# Patient Record
Sex: Female | Born: 1965 | Race: White | Hispanic: No | Marital: Married | Smoking: Never smoker
Health system: Southern US, Community
[De-identification: ages and names within clinical notes are randomized; demographics above are authoritative.]

## PROBLEM LIST (undated history)

## (undated) DIAGNOSIS — T7840XA Allergy, unspecified, initial encounter: Secondary | ICD-10-CM

## (undated) DIAGNOSIS — E559 Vitamin D deficiency, unspecified: Secondary | ICD-10-CM

## (undated) DIAGNOSIS — E079 Disorder of thyroid, unspecified: Secondary | ICD-10-CM

## (undated) DIAGNOSIS — E539 Vitamin B deficiency, unspecified: Secondary | ICD-10-CM

## (undated) DIAGNOSIS — E785 Hyperlipidemia, unspecified: Secondary | ICD-10-CM

## (undated) DIAGNOSIS — M1990 Unspecified osteoarthritis, unspecified site: Secondary | ICD-10-CM

## (undated) HISTORY — DX: Allergy, unspecified, initial encounter: T78.40XA

## (undated) HISTORY — DX: Vitamin D deficiency, unspecified: E55.9

## (undated) HISTORY — PX: COLONOSCOPY: SHX174

## (undated) HISTORY — DX: Vitamin B deficiency, unspecified: E53.9

## (undated) HISTORY — PX: POLYPECTOMY: SHX149

## (undated) HISTORY — DX: Hyperlipidemia, unspecified: E78.5

## (undated) HISTORY — DX: Disorder of thyroid, unspecified: E07.9

## (undated) HISTORY — DX: Unspecified osteoarthritis, unspecified site: M19.90

## (undated) HISTORY — PX: PARTIAL HYSTERECTOMY: SHX80

## (undated) HISTORY — PX: ABDOMINAL HYSTERECTOMY: SHX81

## (undated) HISTORY — PX: TOTAL ABDOMINAL HYSTERECTOMY: SHX209

## (undated) HISTORY — PX: NECK SURGERY: SHX720

---

## 2009-09-20 ENCOUNTER — Encounter: Admission: RE | Admit: 2009-09-20 | Discharge: 2009-09-20 | Payer: Self-pay | Admitting: Allergy and Immunology

## 2010-06-28 ENCOUNTER — Emergency Department (HOSPITAL_COMMUNITY)
Admission: EM | Admit: 2010-06-28 | Discharge: 2010-06-29 | Disposition: A | Discharge status: Home / Self Care | Payer: BC Managed Care – PPO | Attending: Emergency Medicine | Admitting: Emergency Medicine

## 2010-06-28 DIAGNOSIS — G43909 Migraine, unspecified, not intractable, without status migrainosus: Secondary | ICD-10-CM | POA: Insufficient documentation

## 2010-06-29 LAB — POCT I-STAT, CHEM 8
BUN: 11 mg/dL (ref 6–23)
Creatinine, Ser: 0.7 mg/dL (ref 0.4–1.2)
TCO2: 30 mmol/L (ref 0–100)

## 2011-01-14 ENCOUNTER — Encounter: Payer: Self-pay | Admitting: *Deleted

## 2011-01-14 ENCOUNTER — Emergency Department (HOSPITAL_COMMUNITY)
Admission: EM | Admit: 2011-01-14 | Discharge: 2011-01-14 | Disposition: A | Discharge status: Home / Self Care | Payer: Managed Care, Other (non HMO) | Attending: Emergency Medicine | Admitting: Emergency Medicine

## 2011-01-14 DIAGNOSIS — G43909 Migraine, unspecified, not intractable, without status migrainosus: Secondary | ICD-10-CM | POA: Insufficient documentation

## 2011-01-14 MED ORDER — SUMATRIPTAN SUCCINATE 6 MG/0.5ML ~~LOC~~ SOLN
6.0000 mg | Freq: Once | SUBCUTANEOUS | Status: AC
  Administered 2011-01-14: 6 mg via SUBCUTANEOUS
  Filled 2011-01-14: qty 0.5

## 2011-01-14 NOTE — ED Notes (Signed)
Pt c/o migraine starting yesterday at noon. Pt c/o nausea starting at 0200, took zofran odt w/ relief from nausea. Denies vomiting.

## 2011-01-14 NOTE — ED Provider Notes (Signed)
History     CSN: [id number] Arrival date & time: 01/14/2011  4:32 AM   First MD Initiated Contact with Patient 01/14/11 0539      Chief Complaint  Patient presents with  . Migraine  . Nausea    (Consider location/radiation/quality/duration/timing/severity/associated sxs/prior treatment) HPI Comments: Patient has a history of migraine headaches and she was 45 years old. Patient developed a right-sided headache similar to her migraine pain at 12:30 PM yesterday. It gradually has gotten worse by 11 PM. She is out of her usual Imitrex subcutaneous that she normally takes. She cannot get it refilled until today. She has some mild nausea and endorses mild photophobia. She denies any recent head injury, fevers, neck stiffness. Again she reports that this headache is exactly like her prior migraines.  Patient is a 45 y.o. female presenting with migraine. The history is provided by the patient.  Migraine Associated symptoms include headaches. Pertinent negatives include no abdominal pain.    Past Medical History  Diagnosis Date  . Migraine     Past Surgical History  Procedure Date  . Abdominal hysterectomy     History reviewed. No pertinent family history.  History  Substance Use Topics  . Smoking status: Never Smoker   . Smokeless tobacco: Not on file  . Alcohol Use: No    OB History    Grav Para Term Preterm Abortions TAB SAB Ect Mult Living                  Review of Systems  Constitutional: Negative.   HENT: Negative for congestion, rhinorrhea and sinus pressure.   Eyes: Positive for photophobia.  Gastrointestinal: Positive for nausea. Negative for vomiting and abdominal pain.  Neurological: Positive for headaches. Negative for dizziness, weakness, light-headedness and numbness.    Allergies  Review of patient's allergies indicates no known allergies.  Home Medications   Current Outpatient Rx  Name Route Sig Dispense Refill  . ONDANSETRON 8 MG PO TBDP Oral  Take 8 mg by mouth every 8 (eight) hours as needed.      . SUMATRIPTAN SUCCINATE 6 MG/0.5ML Cool SOLN Subcutaneous Inject 6 mg into the skin every 2 (two) hours as needed. F    . ZONISAMIDE 100 MG PO CAPS Oral Take 100 mg by mouth daily.       BP 133/76  Pulse 76  Temp(Src) 97.9 F (36.6 C) (Oral)  Resp 18  Ht 5\' 9"  (1.753 m)  Wt 166 lb 10.7 oz (75.6 kg)  BMI 24.61 kg/m2  SpO2 100%  Physical Exam  Constitutional: She is oriented to person, place, and time. She appears well-developed and well-nourished. No distress.  HENT:  Head: Normocephalic and atraumatic.  Neck: Normal range of motion. Neck supple.  Abdominal: Soft. There is no tenderness.  Neurological: She is alert and oriented to person, place, and time. She has normal reflexes. No cranial nerve deficit. Coordination normal.  Skin: Skin is warm and dry.  Psychiatric: She has a normal mood and affect.    ED Course  Procedures (including critical care time)  Labs Reviewed - No data to display No results found.   No diagnosis found.    MDM    Patient reports she is quite certain that if she was given Imitrex subcutaneous that she would feel better within 5-10 minutes. I suspect that this is just a typical migraine for her. Plan is to give her subcutaneous Imitrex and if she feels improved we'll discharge her home.  Gavin Pound. Oletta Lamas, MD 01/14/11 [phone]

## 2011-02-15 ENCOUNTER — Ambulatory Visit (HOSPITAL_BASED_OUTPATIENT_CLINIC_OR_DEPARTMENT_OTHER)
Admission: RE | Admit: 2011-02-15 | Discharge: 2011-02-15 | Disposition: A | Discharge status: Home / Self Care | Payer: Managed Care, Other (non HMO) | Source: Ambulatory Visit | Attending: Family Medicine | Admitting: Family Medicine

## 2011-02-15 ENCOUNTER — Other Ambulatory Visit (HOSPITAL_BASED_OUTPATIENT_CLINIC_OR_DEPARTMENT_OTHER): Payer: Self-pay | Admitting: Family Medicine

## 2011-02-15 DIAGNOSIS — G43909 Migraine, unspecified, not intractable, without status migrainosus: Secondary | ICD-10-CM | POA: Insufficient documentation

## 2012-02-06 ENCOUNTER — Ambulatory Visit (INDEPENDENT_AMBULATORY_CARE_PROVIDER_SITE_OTHER): Payer: BC Managed Care – PPO | Admitting: Family Medicine

## 2012-02-06 VITALS — BP 110/78 | HR 77 | Temp 98.1°F | Resp 16 | Ht 68.0 in | Wt 158.0 lb

## 2012-02-06 DIAGNOSIS — J069 Acute upper respiratory infection, unspecified: Secondary | ICD-10-CM

## 2012-02-06 DIAGNOSIS — J0100 Acute maxillary sinusitis, unspecified: Secondary | ICD-10-CM

## 2012-02-06 MED ORDER — FLUTICASONE PROPIONATE 50 MCG/ACT NA SUSP: 2.0000 | Freq: Every day | NASAL | Status: DC

## 2012-02-06 MED ORDER — AMOXICILLIN-POT CLAVULANATE 875-125 MG PO TABS: 1.0000 | ORAL_TABLET | Freq: Two times a day (BID) | ORAL | Status: DC

## 2012-02-06 NOTE — Patient Instructions (Addendum)
1. Sinusitis, acute maxillary  amoxicillin-clavulanate (AUGMENTIN) 875-125 MG per tablet, fluticasone (FLONASE) 50 MCG/ACT nasal spray  2. Acute upper respiratory infections of unspecified site       CONTINUE MUCINEX TWICE DAILY FOR THE NEXT WEEK.

## 2012-02-06 NOTE — Progress Notes (Signed)
[address]   [place], [place]  [address]   [id number]  Subjective:    Patient ID: Elizabeth Harris, female    DOB: 1965/03/11, 46 y.o.   MRN: [id number]  HPIThis 46 y.o. female presents for evaluation of sinus congestion.  Onset five days ago.  Treated with Mucinex.  Now has spread to chest.  No fever/chills/sweats.  +sinus headache frontal region B.  +R ear pain; mild ST due to drainage.  Mild nasal congestion; no rhinorrhea.  No cough.  No v/d.  No SOB with ambulation.  No asthma hx; no tobacco abuse.  No other medications.   PCP:  Sharyne Peach, MD  Review of Systems  Constitutional: Negative for fever, chills, diaphoresis and fatigue.  HENT: Positive for ear pain, congestion, sore throat, voice change, postnasal drip and sinus pressure. Negative for hearing loss, rhinorrhea, sneezing, mouth sores and trouble swallowing.   Respiratory: Negative for cough, shortness of breath and wheezing.   Gastrointestinal: Positive for nausea. Negative for vomiting and diarrhea.  Neurological: Positive for headaches.    Past Medical History  Diagnosis Date  . Migraine   . Allergy   . Arthritis     Past Surgical History  Procedure Date  . Abdominal hysterectomy     Prior to Admission medications   Medication Sig Start Date End Date Taking? Authorizing Provider  SUMAtriptan (IMITREX) 6 MG/0.5ML SOLN injection Inject 6 mg into the skin every 2 (two) hours as needed. F   Yes Historical Provider, MD  zonisamide (ZONEGRAN) 100 MG capsule Take 100 mg by mouth daily.    Yes Historical Provider, MD  amoxicillin-clavulanate (AUGMENTIN) 875-125 MG per tablet Take 1 tablet by mouth 2 (two) times daily. 02/06/12   Ethelda Chick, MD  fluticasone (FLONASE) 50 MCG/ACT nasal spray Place 2 sprays into the nose daily. 02/06/12   Ethelda Chick, MD  ondansetron (ZOFRAN-ODT) 8 MG disintegrating tablet Take 8 mg by mouth every 8 (eight) hours as needed.      Historical Provider, MD    No Known  Allergies  History   Social History  . Marital Status: Married    Spouse Name: N/A    Number of Children: N/A  . Years of Education: N/A   Occupational History  . Not on file.   Social History Main Topics  . Smoking status: Never Smoker   . Smokeless tobacco: Not on file  . Alcohol Use: No  . Drug Use: No  . Sexually Active:    Other Topics Concern  . 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      No family history on file.      Objective:   Physical Exam  Nursing note and vitals reviewed. Constitutional: She is oriented to person, place, and time. She appears well-developed and well-nourished. No distress.  HENT:  Head: Normocephalic and atraumatic.  Right Ear: External ear normal.  Left Ear: External ear normal.  Nose: Rhinorrhea present.  Mouth/Throat: Mucous membranes are normal. Oropharyngeal exudate present. No posterior oropharyngeal edema, posterior oropharyngeal erythema or tonsillar abscesses.  Eyes: Conjunctivae normal are normal. Pupils are equal, round, and reactive to light.  Neck: Normal range of motion. Neck supple.  Cardiovascular: Normal rate, regular rhythm, normal heart sounds and intact distal pulses.   Pulmonary/Chest: Effort normal and breath sounds normal.  Lymphadenopathy:    She has no cervical adenopathy.  Neurological: She is  alert and oriented to person, place, and time.  Skin: Skin is warm and dry. No rash noted. She is not diaphoretic.  Psychiatric: She has a normal mood and affect. Her behavior is normal. Judgment and thought content normal.       Assessment & Plan:   1. Sinusitis, acute maxillary  amoxicillin-clavulanate (AUGMENTIN) 875-125 MG per tablet, fluticasone (FLONASE) 50 MCG/ACT nasal spray  2. Acute upper respiratory infections of unspecified site        1. Acute Sinusitis Maxillary:  New.  Rx for Augmentin, Flonase.  Continue Mucinex  bid.  Supportive care with rest, fluids, Tylenol or Motrin. 2.  URI: New.  Etiology to sinusitis.     Meds ordered this encounter  Medications  . amoxicillin-clavulanate (AUGMENTIN) 875-125 MG per tablet    Sig: Take 1 tablet by mouth 2 (two) times daily.    Dispense:  20 tablet    Refill:  0  . fluticasone (FLONASE) 50 MCG/ACT nasal spray    Sig: Place 2 sprays into the nose daily.    Dispense:  16 g    Refill:  6

## 2012-10-19 ENCOUNTER — Ambulatory Visit (INDEPENDENT_AMBULATORY_CARE_PROVIDER_SITE_OTHER): Payer: BC Managed Care – HMO | Admitting: Physician Assistant

## 2012-10-19 VITALS — BP 116/80 | HR 84 | Temp 98.1°F | Resp 16 | Ht 68.25 in | Wt 166.0 lb

## 2012-10-19 DIAGNOSIS — J302 Other seasonal allergic rhinitis: Secondary | ICD-10-CM | POA: Insufficient documentation

## 2012-10-19 DIAGNOSIS — J309 Allergic rhinitis, unspecified: Secondary | ICD-10-CM

## 2012-10-19 DIAGNOSIS — J329 Chronic sinusitis, unspecified: Secondary | ICD-10-CM

## 2012-10-19 DIAGNOSIS — G43909 Migraine, unspecified, not intractable, without status migrainosus: Secondary | ICD-10-CM | POA: Insufficient documentation

## 2012-10-19 MED ORDER — IPRATROPIUM BROMIDE 0.03 % NA SOLN: 2.0000 | Freq: Two times a day (BID) | NASAL | Status: DC

## 2012-10-19 MED ORDER — AMOXICILLIN-POT CLAVULANATE 875-125 MG PO TABS: 1.0000 | ORAL_TABLET | Freq: Two times a day (BID) | ORAL | Status: DC

## 2012-10-19 NOTE — Progress Notes (Signed)
Subjective:    Patient ID: Elizabeth Harris, female    DOB: 04-Dec-1965, 47 y.o.   MRN: [id number]  HPI This 47 y.o. female presents for evaluation of "sinusitis."  Has chronic allergies, maintained on Flonase and OTC Zyrtec.  She previously received immunotherapy at [facility], but stopped due to work and school and difficulty getting there for injections. She has had increased symptoms this summer and has called to schedule follow-up to consider restarting injections.  In the meantime, her symptoms have worsened more and she believes that she has an infection.  Last sinusitis was 01/2012.  She reports congestion, drainage, pressure in her face.  Since last night she has also had pain in her teeth and ears.  No fever, chills, N/V, diarrhea, myalgias/arthralgias or rash.   Past Medical History  Diagnosis Date  . Migraine   . Allergy   . Arthritis     Past Surgical History  Procedure Laterality Date  . Abdominal hysterectomy      Prior to Admission medications   Medication Sig Start Date End Date Taking? Authorizing Provider  cetirizine (ZYRTEC) 10 MG tablet Take 10 mg by mouth daily.   Yes Historical Provider, MD  fluticasone (FLONASE) 50 MCG/ACT nasal spray Place 2 sprays into the nose daily. 02/06/12  Yes Ethelda Chick, MD  SUMAtriptan (IMITREX) 6 MG/0.5ML SOLN injection Inject 6 mg into the skin every 2 (two) hours as needed. F   Yes Historical Provider, MD  zonisamide (ZONEGRAN) 100 MG capsule Take 100 mg by mouth daily.    Yes Historical Provider, MD    No Known Allergies  History   Social History  . Marital Status: Married    Spouse Name: Shenicka Sunderlin    Number of Children: 1  . Years of Education: N/A   Occupational History  . Project Coordinator    Social History Main Topics  . Smoking status: Never Smoker   . Smokeless tobacco: Never Used  . Alcohol Use: No  . Drug Use: No  . Sexual Activity: Not on file   Other Topics Concern  . 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     Lives with her husband.   Her son Marsa Aris lives with them. His children, Thayer Ohm and Vernona Rieger, also live with them.    Family History  Problem Relation Age of Onset  . Hypertension Mother   . Diabetes Father   . Heart disease Father       Review of Systems As above.    Objective:   Physical Exam  Vitals reviewed. Constitutional: She is oriented to person, place, and time. Vital signs are normal. She appears well-developed and well-nourished. No distress.  HENT:  Head: Normocephalic and atraumatic.  Right Ear: Hearing, tympanic membrane, external ear and ear canal normal.  Left Ear: Hearing, tympanic membrane, external ear and ear canal normal.  Nose: Mucosal edema and rhinorrhea present.  No foreign bodies. Right sinus exhibits no maxillary sinus tenderness and no frontal sinus tenderness. Left sinus exhibits no maxillary sinus tenderness and no frontal sinus tenderness.  Mouth/Throat: Uvula is midline, oropharynx is clear and moist and mucous membranes are normal. No edematous. No oropharyngeal exudate.  Eyes: Conjunctivae and EOM are normal. Pupils are equal, round, and reactive to light. Right eye exhibits no discharge. Left eye exhibits no discharge. No scleral icterus.  Neck: Trachea normal, normal range of motion and full passive  range of motion without pain. Neck supple. No mass and no thyromegaly present.  Cardiovascular: Normal rate, regular rhythm and normal heart sounds.   Pulmonary/Chest: Effort normal and breath sounds normal.  Lymphadenopathy:       Head (right side): No submandibular, no tonsillar, no preauricular, no posterior auricular and no occipital adenopathy present.       Head (left side): No submandibular, no tonsillar, no preauricular and no occipital adenopathy present.    She has no cervical adenopathy.       Right: No supraclavicular adenopathy present.        Left: No supraclavicular adenopathy present.  Neurological: She is alert and oriented to person, place, and time. She has normal strength. No cranial nerve deficit or sensory deficit.  Skin: Skin is warm, dry and intact. No rash noted.  Psychiatric: She has a normal mood and affect. Her speech is normal and behavior is normal.          Assessment & Plan:  Allergic rhinitis -Plan: proceed with follow-up with allergist.  She'll ask about Singulair as a possible addition to her regimen.  Sinusitis - Plan: amoxicillin-clavulanate (AUGMENTIN) 875-125 MG per tablet, ipratropium (ATROVENT) 0.03 % nasal spray  Fernande Bras, PA-C Physician Assistant-Certified [facility]

## 2012-10-19 NOTE — Patient Instructions (Addendum)
Continue the Flonase and Zyrtec. Use Mucinex Maximum Strngth. Increase your water intake, and get plenty of rest. Ask your allergy specialist about Singulair.

## 2013-03-11 ENCOUNTER — Ambulatory Visit (INDEPENDENT_AMBULATORY_CARE_PROVIDER_SITE_OTHER): Payer: BC Managed Care – PPO | Admitting: Family Medicine

## 2013-03-11 VITALS — BP 138/78 | HR 80 | Temp 98.3°F | Resp 18 | Ht 68.25 in | Wt 173.6 lb

## 2013-03-11 DIAGNOSIS — R51 Headache: Secondary | ICD-10-CM

## 2013-03-11 DIAGNOSIS — R110 Nausea: Secondary | ICD-10-CM

## 2013-03-11 DIAGNOSIS — J111 Influenza due to unidentified influenza virus with other respiratory manifestations: Secondary | ICD-10-CM

## 2013-03-11 LAB — POCT CBC
Granulocyte percent: 74.3 %G (ref 37–80)
HEMATOCRIT: 47.7 % (ref 37.7–47.9)
HEMOGLOBIN: 15 g/dL (ref 12.2–16.2)
LYMPH, POC: 0.9 (ref 0.6–3.4)
MCH: 31.6 pg — AB (ref 27–31.2)
MCHC: 31.4 g/dL — AB (ref 31.8–35.4)
MCV: 100.4 fL — AB (ref 80–97)
MID (cbc): 0.3 (ref 0–0.9)
MPV: 8.1 fL (ref 0–99.8)
POC Granulocyte: 3.7 (ref 2–6.9)
POC LYMPH PERCENT: 19 %L (ref 10–50)
POC MID %: 6.7 %M (ref 0–12)
Platelet Count, POC: 211 10*3/uL (ref 142–424)
RBC: 4.75 M/uL (ref 4.04–5.48)
RDW, POC: 12.5 %
WBC: 5 10*3/uL (ref 4.6–10.2)

## 2013-03-11 LAB — GLUCOSE, POCT (MANUAL RESULT ENTRY): POC Glucose: 115 mg/dl — AB (ref 70–99)

## 2013-03-11 MED ORDER — KETOROLAC TROMETHAMINE 60 MG/2ML IM SOLN
60.0000 mg | Freq: Once | INTRAMUSCULAR | Status: AC
  Administered 2013-03-11: 60 mg via INTRAMUSCULAR

## 2013-03-11 MED ORDER — ONDANSETRON 4 MG PO TBDP: 4.0000 mg | ORAL_TABLET | Freq: Once | ORAL | Status: DC

## 2013-03-11 MED ORDER — ONDANSETRON 4 MG PO TBDP
8.0000 mg | ORAL_TABLET | Freq: Once | ORAL | Status: AC
  Administered 2013-03-11: 8 mg via ORAL

## 2013-03-11 NOTE — Progress Notes (Addendum)
Subjective:    Patient ID: Elizabeth Harris, female    DOB: 11-Feb-1966, 48 y.o.   MRN: [id number] This chart was scribed for Wardell Honour, MD by Anastasia Pall, ED Scribe. This patient was seen in room 14 and the patient's care was started at 8:18 PM.  Chief Complaint  Patient presents with  . Headache    x monday , has taken half of script of imitrex. Tested positive for flu on tuesday   . Fever  . Nausea  . Cough    has alot of congestion; feels heaviness in chest     HPI Elizabeth Harris is a 48 y.o. female who presents to the [facility] complaining of flu like symptoms, including headache, nausea, deep cough, chest congestion, sore throat, and rhinorrhea, onset 4-5 days ago. She reports fever, max temp 102, onset 2 days ago. She reports being diagnosed with flu 3 days ago at [facility]. She reports flu swab came back with a strong positive. She denies knowing if it was A or B. She has been taking Rimantadine, Mucinex, and Advil. She reports being cold natured, but recently having been warm enough to where she was walking outside without jacket. She reports that she may be dehydrated, due to nausea and headache. She reports h/o left sided migraines, but reports that this head pain has been all over. She reports applying ice pack, with some relief. She states the ice also helps her nausea, keeps her from vomiting. She denies light and loud noises exacerbating her migraines. She reports having 5 glasses of water, 1 16oz Gatorade. She states she has been trying to drink the same over the past few days. She denies SOB, vomiting, diarrhea, and any other associated symptoms.  PCP Marguerita Merles, MD  Patient Active Problem List   Diagnosis Date Noted  . Allergic rhinitis 10/19/2012  . Migraine 10/19/2012   Past Medical History  Diagnosis Date  . Migraine   . Allergy   . Arthritis    Past Surgical History  Procedure Laterality Date  . Abdominal hysterectomy     No Known  Allergies Prior to Admission medications   Medication Sig Start Date End Date Taking? Authorizing Provider  cetirizine (ZYRTEC) 10 MG tablet Take 10 mg by mouth daily.   Yes Historical Provider, MD  fluticasone (FLONASE) 50 MCG/ACT nasal spray Place 2 sprays into the nose daily. 02/06/12  Yes Wardell Honour, MD  SUMAtriptan (IMITREX) 6 MG/0.5ML SOLN injection Inject 6 mg into the skin every 2 (two) hours as needed. F   Yes Historical Provider, MD  amoxicillin-clavulanate (AUGMENTIN) 875-125 MG per tablet Take 1 tablet by mouth 2 (two) times daily. 10/19/12   Chelle S Jeffery, PA-C  ipratropium (ATROVENT) 0.03 % nasal spray Place 2 sprays into the nose 2 (two) times daily. 10/19/12   Chelle S Jeffery, PA-C  zonisamide (ZONEGRAN) 100 MG capsule Take 100 mg by mouth daily.     Historical Provider, MD    Review of Systems  Constitutional: Positive for fever.  HENT: Positive for congestion, rhinorrhea and sore throat.   Respiratory: Positive for cough. Negative for shortness of breath.   Gastrointestinal: Positive for nausea. Negative for vomiting, abdominal pain and diarrhea.  Neurological: Positive for headaches.       Objective:   Physical Exam  Nursing note and vitals reviewed. Constitutional: She is oriented to person, place, and time. She appears well-developed and well-nourished. No distress.  HENT:  Head: Normocephalic and atraumatic.  Right Ear: Tympanic membrane and external ear normal.  Left Ear: Tympanic membrane and external ear normal.  Nose: Right sinus exhibits maxillary sinus tenderness and frontal sinus tenderness. Left sinus exhibits maxillary sinus tenderness and frontal sinus tenderness.  Mouth/Throat: Uvula is midline, oropharynx is clear and moist and mucous membranes are normal. No oropharyngeal exudate.  Eyes: EOM are normal.  Neck: Neck supple. No thyromegaly present.  Cardiovascular: Normal rate, regular rhythm and normal heart sounds.   No murmur  heard. Pulmonary/Chest: Effort normal and breath sounds normal. No respiratory distress. She has no wheezes. She has no rales.  Abdominal: Soft. There is no tenderness.  Musculoskeletal: Normal range of motion.  Lymphadenopathy:    She has no cervical adenopathy.  Neurological: She is alert and oriented to person, place, and time.  Neurovascular intact.   Skin: Skin is warm and dry.  Psychiatric: She has a normal mood and affect. Her behavior is normal.    BP 138/78  Pulse 80  Temp(Src) 98.3 F (36.8 C) (Oral)  Resp 18  Ht 5' 8.25" (1.734 m)  Wt 173 lb 9.6 oz (78.744 kg)  BMI 26.19 kg/m2  SpO2 98%  Results for orders placed in visit on 03/11/13  POCT CBC      Result Value Range   WBC 5.0  4.6 - 10.2 K/uL   Lymph, poc 0.9  0.6 - 3.4   POC LYMPH PERCENT 19.0  10 - 50 %L   MID (cbc) 0.3  0 - 0.9   POC MID % 6.7  0 - 12 %M   POC Granulocyte 3.7  2 - 6.9   Granulocyte percent 74.3  37 - 80 %G   RBC 4.75  4.04 - 5.48 M/uL   Hemoglobin 15.0  12.2 - 16.2 g/dL   HCT, POC 47.7  37.7 - 47.9 %   MCV 100.4 (*) 80 - 97 fL   MCH, POC 31.6 (*) 27 - 31.2 pg   MCHC 31.4 (*) 31.8 - 35.4 g/dL   RDW, POC 12.5     Platelet Count, POC 211  142 - 424 K/uL   MPV 8.1  0 - 99.8 fL  GLUCOSE, POCT (MANUAL RESULT ENTRY)      Result Value Range   POC Glucose 115 (*) 70 - 99 mg/dl       Assessment & Plan:   1. Nausea alone   2. Headache(784.0)   3. Influenza     1. Influenza:  New.  Recommend supportive care with OTC medications. 2.  Headache: New. Secondary to acute influenza; s/p Toradol 60mg  IM in office; s/p Zofran 8mg  ODT in office as well.  S/p ivf in office. 3. Nausea: New.  S/p Zofran in office.  Recommend BRAT diet, hydration.     Meds ordered this encounter  Medications  . DISCONTD: ondansetron (ZOFRAN-ODT) disintegrating tablet 4 mg    Sig:   . ketorolac (TORADOL) injection 60 mg    Sig:   . ondansetron (ZOFRAN-ODT) disintegrating tablet 8 mg    Sig:      I personally  performed the services described in this documentation, which was scribed in my presence.  The recorded information has been reviewed and is accurate.  Reginia Forts, M.D.  [facility] [address] [place], [place]  [address] [phone] phone [phone] fax

## 2013-06-22 ENCOUNTER — Ambulatory Visit (INDEPENDENT_AMBULATORY_CARE_PROVIDER_SITE_OTHER): Payer: BC Managed Care – PPO | Admitting: Family Medicine

## 2013-06-22 VITALS — BP 130/86 | HR 77 | Temp 98.0°F | Resp 14 | Ht 67.5 in | Wt 175.8 lb

## 2013-06-22 DIAGNOSIS — J309 Allergic rhinitis, unspecified: Secondary | ICD-10-CM

## 2013-06-22 NOTE — Progress Notes (Signed)
   Subjective:    Patient ID: Elizabeth Harris, female    DOB: 17-Feb-1966, 48 y.o.   MRN: [id number]  HPI Patient with 1 day history sinus headache. She has chronic migraines and tried a dose of Imitrex last night when her headache started. Right temple headache to back of head.  Has noticed large amount of post nasal drainage  Takes Zyrtec and flonase daily. Has taken mucinex. Has long history of seasonal allergies. Had allergy shots for many years.   Review of Systems No sore throat, no ear pain, ? Subjective fever last nigh, no cough, no wheezing, no shortness of breath.    Objective:   Physical Exam  Vitals reviewed. Constitutional: She appears well-developed and well-nourished. No distress.  HENT:  Head: Normocephalic and atraumatic.  Right Ear: Tympanic membrane, external ear and ear canal normal.  Left Ear: Tympanic membrane, external ear and ear canal normal.  Nose: Nose normal. Right sinus exhibits no maxillary sinus tenderness and no frontal sinus tenderness. Left sinus exhibits no maxillary sinus tenderness and no frontal sinus tenderness.  Mouth/Throat: Oropharyngeal exudate present.  Eyes: Conjunctivae are normal. Right eye exhibits no discharge. Left eye exhibits no discharge. No scleral icterus.  Neck: Normal range of motion. Neck supple.  Cardiovascular: Normal rate, regular rhythm and normal heart sounds.   Pulmonary/Chest: Effort normal and breath sounds normal.  Lymphadenopathy:    She has no cervical adenopathy.  Skin: She is not diaphoretic.      Assessment & Plan:  1. Allergic rhinitis -Patient with one day history of sinus headache and post nasal drainage. Will optimize supportive therapy. Provided written and verbal information about diagnosis, encouraged her to add decongestant, continue mucinex can add nasonex that she has at home. Increase fluids. Follow up if no improvement in 5-7 days or if worsening symptoms, fever.  Elby Beck,  FNP-BC  [facility]  06/22/2013 2:18 PM

## 2013-06-22 NOTE — Patient Instructions (Signed)
Add sudafed every morning and after lunch for congestion Take mucinex (plain) Add nasonex at bedtime Drink 8-10 glasses of fluids   Allergic Rhinitis Allergic rhinitis is when the mucous membranes in the nose respond to allergens. Allergens are particles in the air that cause your body to have an allergic reaction. This causes you to release allergic antibodies. Through a chain of events, these eventually cause you to release histamine into the blood stream. Although meant to protect the body, it is this release of histamine that causes your discomfort, such as frequent sneezing, congestion, and an itchy, runny nose.  CAUSES  Seasonal allergic rhinitis (hay fever) is caused by pollen allergens that may come from grasses, trees, and weeds. Year-round allergic rhinitis (perennial allergic rhinitis) is caused by allergens such as house dust mites, pet dander, and mold spores.  SYMPTOMS   Nasal stuffiness (congestion).  Itchy, runny nose with sneezing and tearing of the eyes. DIAGNOSIS  Your health care provider can help you determine the allergen or allergens that trigger your symptoms. If you and your health care provider are unable to determine the allergen, skin or blood testing may be used. TREATMENT  Allergic Rhinitis does not have a cure, but it can be controlled by:  Medicines and allergy shots (immunotherapy).  Avoiding the allergen. Hay fever may often be treated with antihistamines in pill or nasal spray forms. Antihistamines block the effects of histamine. There are over-the-counter medicines that may help with nasal congestion and swelling around the eyes. Check with your health care provider before taking or giving this medicine.  If avoiding the allergen or the medicine prescribed do not work, there are many new medicines your health care provider can prescribe. Stronger medicine may be used if initial measures are ineffective. Desensitizing injections can be used if medicine and  avoidance does not work. Desensitization is when a patient is given ongoing shots until the body becomes less sensitive to the allergen. Make sure you follow up with your health care provider if problems continue. HOME CARE INSTRUCTIONS It is not possible to completely avoid allergens, but you can reduce your symptoms by taking steps to limit your exposure to them. It helps to know exactly what you are allergic to so that you can avoid your specific triggers. SEEK MEDICAL CARE IF:   You have a fever.  You develop a cough that does not stop easily (persistent).  You have shortness of breath.  You start wheezing.  Symptoms interfere with normal daily activities. Document Released: 11/06/2000 Document Revised: 12/02/2012 Document Reviewed: 10/19/2012 [facility] Patient Information 2014 [place].

## 2013-06-22 NOTE — Progress Notes (Signed)
I have discussed this case with Ms. Gessner, NP and agree.  

## 2015-02-01 ENCOUNTER — Encounter: Payer: Self-pay | Admitting: Physician Assistant

## 2015-02-01 ENCOUNTER — Ambulatory Visit (INDEPENDENT_AMBULATORY_CARE_PROVIDER_SITE_OTHER): Payer: BLUE CROSS/BLUE SHIELD | Admitting: Physician Assistant

## 2015-02-01 VITALS — BP 123/77 | HR 77 | Temp 98.0°F | Resp 16 | Ht 68.0 in | Wt 178.0 lb

## 2015-02-01 DIAGNOSIS — H6122 Impacted cerumen, left ear: Secondary | ICD-10-CM

## 2015-02-01 DIAGNOSIS — J069 Acute upper respiratory infection, unspecified: Secondary | ICD-10-CM | POA: Diagnosis not present

## 2015-02-01 MED ORDER — IBUPROFEN 600 MG PO TABS: 600.0000 mg | ORAL_TABLET | Freq: Three times a day (TID) | ORAL | Status: DC | PRN

## 2015-02-01 MED ORDER — CETIRIZINE-PSEUDOEPHEDRINE ER 5-120 MG PO TB12: 1.0000 | ORAL_TABLET | ORAL | Status: AC

## 2015-02-01 NOTE — Progress Notes (Signed)
02/01/2015 3:11 PM   DOB: [birth year] / MRN: [id number]  SUBJECTIVE:  Elizabeth Harris is a 49 y.o. female presenting for for the evaluation of runny nose, congestion, cough and sore throat that started 3 days ago.  Associated symptoms include no other symptoms today and she denies headache and jaw pain. Treatments tried thus far include acetaminophen with pheneyleprhine with fair  relief. She reports sick contacts.  She has No Known Allergies.   She  has a past medical history of Migraine; Allergy; and Arthritis.    She  reports that she has never smoked. She has never used smokeless tobacco. She reports that she does not drink alcohol or use illicit drugs. She  has no sexual activity history on file. The patient  has past surgical history that includes Abdominal hysterectomy.  Her family history includes Diabetes in her father; Heart disease in her father; Hypertension in her mother.  Review of Systems  Constitutional: Negative for fever and chills.  Eyes: Negative for blurred vision.  Respiratory: Negative for cough and shortness of breath.   Cardiovascular: Negative for chest pain.  Gastrointestinal: Negative for nausea and abdominal pain.  Genitourinary: Negative for dysuria, urgency and frequency.  Musculoskeletal: Negative for myalgias.  Skin: Negative for rash.  Neurological: Negative for dizziness, tingling and headaches.  Psychiatric/Behavioral: Negative for depression. The patient is not nervous/anxious.     Problem list and medications reviewed and updated by myself where necessary, and exist elsewhere in the encounter.   OBJECTIVE:  BP 123/77 mmHg  Pulse 77  Temp(Src) 98 F (36.7 C) (Oral)  Resp 16  Ht 5\' 8"  (1.727 m)  Wt 178 lb (80.74 kg)  BMI 27.07 kg/m2  Physical Exam  Constitutional: She is oriented to person, place, and time. She appears well-nourished. No distress.  HENT:  Head: Normocephalic and atraumatic.  Right Ear: Hearing and tympanic membrane  normal.  Left Ear: Hearing normal.  Ears:  Nose: Nose normal.  Mouth/Throat: Uvula is midline, oropharynx is clear and moist and mucous membranes are normal.  Eyes: EOM are normal. Pupils are equal, round, and reactive to light.  Cardiovascular: Normal rate.   Pulmonary/Chest: Effort normal.  Abdominal: She exhibits no distension.  Neurological: She is alert and oriented to person, place, and time. No cranial nerve deficit. Gait normal.  Skin: Skin is dry. She is not diaphoretic.  Psychiatric: She has a normal mood and affect.  Vitals reviewed.   No results found for this or any previous visit (from the past 48 hour(s)).  ASSESSMENT AND PLAN:  [name] was seen today for sinus problem and nasal congestion.  Diagnoses and all orders for this visit:  Viral URI Comments: Normal exam. Will treat symptomatically.  Advised that she call for Hycodan 2.5-5 mg qhs total 30 mLs if cough becomes a problem.  Orders: -     cetirizine-pseudoephedrine (ZYRTEC-D ALLERGY & CONGESTION) 5-120 MG tablet; Take 1 tablet by mouth every morning. Do not use OTC cold remedies with this medication. -     ibuprofen (ADVIL,MOTRIN) 600 MG tablet; Take 1 tablet (600 mg total) by mouth every 8 (eight) hours as needed.  Cerumen impaction, left -     Ear Lavage    The patient was advised to call or return to clinic if she does not see an improvement in symptoms or to seek the care of the closest emergency department if she worsens with the above plan.   Philis Fendt, MHS, PA-C [facility]  [facility] 02/01/2015 3:11 PM

## 2015-04-14 ENCOUNTER — Ambulatory Visit
Admission: RE | Admit: 2015-04-14 | Discharge: 2015-04-14 | Disposition: A | Discharge status: Home / Self Care | Payer: BLUE CROSS/BLUE SHIELD | Source: Ambulatory Visit | Attending: Endocrinology | Admitting: Endocrinology

## 2015-04-14 ENCOUNTER — Other Ambulatory Visit: Payer: Self-pay | Admitting: Endocrinology

## 2015-04-14 ENCOUNTER — Other Ambulatory Visit: Payer: Self-pay | Admitting: Internal Medicine

## 2015-04-14 DIAGNOSIS — R519 Headache, unspecified: Secondary | ICD-10-CM

## 2015-04-14 DIAGNOSIS — G43909 Migraine, unspecified, not intractable, without status migrainosus: Secondary | ICD-10-CM

## 2015-04-14 DIAGNOSIS — R51 Headache: Principal | ICD-10-CM

## 2015-05-06 ENCOUNTER — Ambulatory Visit (INDEPENDENT_AMBULATORY_CARE_PROVIDER_SITE_OTHER): Payer: BLUE CROSS/BLUE SHIELD | Admitting: Family Medicine

## 2015-05-06 VITALS — BP 124/76 | HR 83 | Temp 98.0°F | Resp 16 | Ht 68.0 in | Wt 173.0 lb

## 2015-05-06 DIAGNOSIS — R1031 Right lower quadrant pain: Secondary | ICD-10-CM | POA: Diagnosis not present

## 2015-05-06 LAB — POC MICROSCOPIC URINALYSIS (UMFC): Mucus: ABSENT

## 2015-05-06 LAB — POCT URINALYSIS DIP (MANUAL ENTRY)
Bilirubin, UA: NEGATIVE
Blood, UA: NEGATIVE
GLUCOSE UA: NEGATIVE
Ketones, POC UA: NEGATIVE
LEUKOCYTES UA: NEGATIVE
Nitrite, UA: NEGATIVE
Protein Ur, POC: NEGATIVE
SPEC GRAV UA: 1.015
UROBILINOGEN UA: 0.2
pH, UA: 5

## 2015-05-06 MED ORDER — FLUCONAZOLE 150 MG PO TABS: 150.0000 mg | ORAL_TABLET | Freq: Once | ORAL | Status: DC

## 2015-05-06 MED ORDER — PHENAZOPYRIDINE HCL 200 MG PO TABS: 200.0000 mg | ORAL_TABLET | Freq: Three times a day (TID) | ORAL | Status: DC | PRN

## 2015-05-06 NOTE — Patient Instructions (Addendum)
IF you received an x-ray today, you will receive an invoice from [facility]. Please contact [facility] at [phone] with questions or concerns regarding your invoice.   IF you received labwork today, you will receive an invoice from [employer]. Please contact [facility] at [phone] with questions or concerns regarding your invoice.   Our billing staff will not be able to assist you with questions regarding bills from these companies.  You will be contacted with the lab results as soon as they are available. The fastest way to get your results is to activate your My Chart account. Instructions are located on the last page of this paperwork. If you have not heard from [place] regarding the results in 2 weeks, please contact this office.  Abdominal Pain, Adult Many things can cause abdominal pain. Usually, abdominal pain is not caused by a disease and will improve without treatment. It can often be observed and treated at home. Your health care provider will do a physical exam and possibly order blood tests and X-rays to help determine the seriousness of your pain. However, in many cases, more time must pass before a clear cause of the pain can be found. Before that point, your health care provider may not know if you need more testing or further treatment. HOME CARE INSTRUCTIONS Monitor your abdominal pain for any changes. The following actions may help to alleviate any discomfort you are experiencing:  Only take over-the-counter or prescription medicines as directed by your health care provider.  Do not take laxatives unless directed to do so by your health care provider.  Try a clear liquid diet (broth, tea, or water) as directed by your health care provider. Slowly move to a bland diet as tolerated. SEEK MEDICAL CARE IF:  You have unexplained abdominal pain.  You have abdominal pain associated with nausea or diarrhea.  You have pain when you  urinate or have a bowel movement.  You experience abdominal pain that wakes you in the night.  You have abdominal pain that is worsened or improved by eating food.  You have abdominal pain that is worsened with eating fatty foods.  You have a fever. SEEK IMMEDIATE MEDICAL CARE IF:  Your pain does not go away within 2 hours.  You keep throwing up (vomiting).  Your pain is felt only in portions of the abdomen, such as the right side or the left lower portion of the abdomen.  You pass bloody or black tarry stools. MAKE SURE YOU:  Understand these instructions.  Will watch your condition.  Will get help right away if you are not doing well or get worse.   This information is not intended to replace advice given to you by your health care provider. Make sure you discuss any questions you have with your health care provider.   Document Released: 11/21/2004 Document Revised: 11/02/2014 Document Reviewed: 10/21/2012 Elsevier Interactive Patient Education [employer].

## 2015-05-06 NOTE — Progress Notes (Signed)
Subjective:  By signing my name below, I, Raven Small, attest that this documentation has been prepared under the direction and in the presence of Delman Cheadle, MD.  Electronically Signed: Thea Alken, ED Scribe. 05/06/2015. 3:50 PM.   Patient ID: Elizabeth Harris, female    DOB: Feb 01, 1966, 50 y.o.   MRN: [id number]  HPI Chief Complaint  Patient presents with  . Abdominal Pain  . Migraine  . Nausea  . Urinary Frequency    HPI Comments: Elizabeth Harris is a 50 y.o. female who presents to the [facility] complaining of constant right low back pain and right sided abdominal pain that began 5 days ago. Pt states she was tested for a UTI 2/8 which was normal. She reports having hx of cyst on right ovary. She has been seen by gynecology recently this month for her yearly exam which was normal. She was advise to com in today to check for UTI.    PCP- Velna Hatchet, MD  Patient Active Problem List   Diagnosis Date Noted  . Allergic rhinitis 10/19/2012  . Migraine 10/19/2012   Past Medical History  Diagnosis Date  . Migraine   . Allergy   . Arthritis    Past Surgical History  Procedure Laterality Date  . Abdominal hysterectomy     No Known Allergies Prior to Admission medications   Medication Sig Start Date End Date Taking? Authorizing Provider  Ascorbic Acid (VITAMIN C PO) Take by mouth daily.    Historical Provider, MD  cetirizine (ZYRTEC) 10 MG tablet Take 10 mg by mouth daily.    Historical Provider, MD  fluticasone (FLONASE) 50 MCG/ACT nasal spray Place 2 sprays into the nose daily. 02/06/12   Wardell Honour, MD  ibuprofen (ADVIL,MOTRIN) 600 MG tablet Take 1 tablet (600 mg total) by mouth every 8 (eight) hours as needed. 02/01/15   Tereasa Coop, PA-C  Multiple Vitamins-Minerals (MULTI-VITAMIN GUMMIES PO) Take by mouth daily.    Historical Provider, MD  SUMAtriptan (IMITREX) 6 MG/0.5ML SOLN injection Inject 6 mg into the skin every 2 (two) hours as  needed. F    Historical Provider, MD  VITAMIN D, CHOLECALCIFEROL, PO Take 2,000 mg by mouth daily.    Historical Provider, MD   Social History   Social History  . Marital Status: Married    Spouse Name: Chrissette Bookwalter  . Number of Children: 1  . Years of Education: N/A   Occupational History  . Project Coordinator    Social History Main Topics  . Smoking status: Never Smoker   . Smokeless tobacco: Never Used  . Alcohol Use: No  . Drug Use: No  . Sexual Activity: Not on file   Other Topics Concern  . 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     Lives with her husband.   Her son Deedra Ehrich lives with them. His children, Gerald Stabs and Mickel Baas, also live with them.   Review of Systems  Constitutional: Negative for fever and chills.  Gastrointestinal: Positive for abdominal pain. Negative for nausea, vomiting, diarrhea, constipation and blood in stool.  Genitourinary: Positive for urgency, flank pain and pelvic pain. Negative for dysuria, frequency, enuresis, difficulty urinating and genital sores.  Musculoskeletal: Positive for back pain. Negative for myalgias, joint swelling and gait problem.  Skin: Negative for color change, pallor and rash.  Hematological: Negative for adenopathy.  Objective:   Physical Exam  Constitutional: She is oriented to person, place, and time. She appears well-developed and well-nourished. No distress.  HENT:  Head: Normocephalic and atraumatic.  Eyes: Conjunctivae and EOM are normal.  Neck: Neck supple.  Cardiovascular: Normal rate.   Pulmonary/Chest: Effort normal.  Musculoskeletal: Normal range of motion.  Neurological: She is alert and oriented to person, place, and time.  Skin: Skin is warm and dry.  Psychiatric: She has a normal mood and affect. Her behavior is normal.  Nursing note and vitals reviewed.   Filed Vitals:   05/06/15 1517  BP: 124/76  Pulse: 83   Temp: 98 F (36.7 C)  TempSrc: Oral  Resp: 16  Height: 5\' 8"  (1.727 m)  Weight: 173 lb (78.472 kg)  SpO2: 98%    Results for orders placed or performed in visit on 05/06/15  POCT Microscopic Urinalysis ([facility])  Result Value Ref Range   WBC,UR,HPF,POC None None WBC/hpf   RBC,UR,HPF,POC None None RBC/hpf   Bacteria Moderate (A) None, Too numerous to count   Mucus Absent Absent   Epithelial Cells, UR Per Microscopy Few (A) None, Too numerous to count cells/hpf  POCT urinalysis dipstick  Result Value Ref Range   Color, UA yellow yellow   Clarity, UA clear clear   Glucose, UA negative negative   Bilirubin, UA negative negative   Ketones, POC UA negative negative   Spec Grav, UA 1.015    Blood, UA negative negative   pH, UA 5.0    Protein Ur, POC negative negative   Urobilinogen, UA 0.2    Nitrite, UA Negative Negative   Leukocytes, UA Negative Negative    Assessment & Plan:   1. Right lower quadrant abdominal pain   UA looks normal as it did prior.  Therapeutic trial with pyridium - if it helps, will call in antibiotic. Pt request antifungal therapy - will f/u with her PCP this week for further eval.  Orders Placed This Encounter  Procedures  . Urine culture  . POCT Microscopic Urinalysis ([facility])  . POCT urinalysis dipstick    Meds ordered this encounter  Medications  . phenazopyridine (PYRIDIUM) 200 MG tablet    Sig: Take 1 tablet (200 mg total) by mouth 3 (three) times daily as needed for pain.    Dispense:  15 tablet    Refill:  0  . fluconazole (DIFLUCAN) 150 MG tablet    Sig: Take 1 tablet (150 mg total) by mouth once.    Dispense:  1 tablet    Refill:  0    I personally performed the services described in this documentation, which was scribed in my presence. The recorded information has been reviewed and considered, and addended by me as needed.  Delman Cheadle, MD MPH

## 2015-05-07 LAB — URINE CULTURE

## 2015-05-29 DIAGNOSIS — R030 Elevated blood-pressure reading, without diagnosis of hypertension: Secondary | ICD-10-CM | POA: Diagnosis not present

## 2015-05-31 DIAGNOSIS — Z6826 Body mass index (BMI) 26.0-26.9, adult: Secondary | ICD-10-CM | POA: Diagnosis not present

## 2015-05-31 DIAGNOSIS — Z124 Encounter for screening for malignant neoplasm of cervix: Secondary | ICD-10-CM | POA: Diagnosis not present

## 2015-05-31 DIAGNOSIS — Z01419 Encounter for gynecological examination (general) (routine) without abnormal findings: Secondary | ICD-10-CM | POA: Diagnosis not present

## 2015-08-16 DIAGNOSIS — M9903 Segmental and somatic dysfunction of lumbar region: Secondary | ICD-10-CM | POA: Diagnosis not present

## 2015-08-16 DIAGNOSIS — M9902 Segmental and somatic dysfunction of thoracic region: Secondary | ICD-10-CM | POA: Diagnosis not present

## 2015-08-16 DIAGNOSIS — M9901 Segmental and somatic dysfunction of cervical region: Secondary | ICD-10-CM | POA: Diagnosis not present

## 2015-08-16 DIAGNOSIS — M531 Cervicobrachial syndrome: Secondary | ICD-10-CM | POA: Diagnosis not present

## 2015-08-18 DIAGNOSIS — M9901 Segmental and somatic dysfunction of cervical region: Secondary | ICD-10-CM | POA: Diagnosis not present

## 2015-08-18 DIAGNOSIS — M9903 Segmental and somatic dysfunction of lumbar region: Secondary | ICD-10-CM | POA: Diagnosis not present

## 2015-08-18 DIAGNOSIS — M9902 Segmental and somatic dysfunction of thoracic region: Secondary | ICD-10-CM | POA: Diagnosis not present

## 2015-08-18 DIAGNOSIS — M531 Cervicobrachial syndrome: Secondary | ICD-10-CM | POA: Diagnosis not present

## 2015-08-21 DIAGNOSIS — M9901 Segmental and somatic dysfunction of cervical region: Secondary | ICD-10-CM | POA: Diagnosis not present

## 2015-08-21 DIAGNOSIS — M9902 Segmental and somatic dysfunction of thoracic region: Secondary | ICD-10-CM | POA: Diagnosis not present

## 2015-08-21 DIAGNOSIS — M9903 Segmental and somatic dysfunction of lumbar region: Secondary | ICD-10-CM | POA: Diagnosis not present

## 2015-08-21 DIAGNOSIS — M531 Cervicobrachial syndrome: Secondary | ICD-10-CM | POA: Diagnosis not present

## 2015-08-24 DIAGNOSIS — M9903 Segmental and somatic dysfunction of lumbar region: Secondary | ICD-10-CM | POA: Diagnosis not present

## 2015-08-24 DIAGNOSIS — M9902 Segmental and somatic dysfunction of thoracic region: Secondary | ICD-10-CM | POA: Diagnosis not present

## 2015-08-24 DIAGNOSIS — M531 Cervicobrachial syndrome: Secondary | ICD-10-CM | POA: Diagnosis not present

## 2015-08-24 DIAGNOSIS — M9901 Segmental and somatic dysfunction of cervical region: Secondary | ICD-10-CM | POA: Diagnosis not present

## 2015-08-31 DIAGNOSIS — M531 Cervicobrachial syndrome: Secondary | ICD-10-CM | POA: Diagnosis not present

## 2015-08-31 DIAGNOSIS — M9901 Segmental and somatic dysfunction of cervical region: Secondary | ICD-10-CM | POA: Diagnosis not present

## 2015-08-31 DIAGNOSIS — M9903 Segmental and somatic dysfunction of lumbar region: Secondary | ICD-10-CM | POA: Diagnosis not present

## 2015-08-31 DIAGNOSIS — M9902 Segmental and somatic dysfunction of thoracic region: Secondary | ICD-10-CM | POA: Diagnosis not present

## 2015-09-22 DIAGNOSIS — M531 Cervicobrachial syndrome: Secondary | ICD-10-CM | POA: Diagnosis not present

## 2015-09-22 DIAGNOSIS — M9901 Segmental and somatic dysfunction of cervical region: Secondary | ICD-10-CM | POA: Diagnosis not present

## 2015-09-22 DIAGNOSIS — M9902 Segmental and somatic dysfunction of thoracic region: Secondary | ICD-10-CM | POA: Diagnosis not present

## 2015-09-22 DIAGNOSIS — M9903 Segmental and somatic dysfunction of lumbar region: Secondary | ICD-10-CM | POA: Diagnosis not present

## 2015-11-01 DIAGNOSIS — M9901 Segmental and somatic dysfunction of cervical region: Secondary | ICD-10-CM | POA: Diagnosis not present

## 2015-11-01 DIAGNOSIS — M531 Cervicobrachial syndrome: Secondary | ICD-10-CM | POA: Diagnosis not present

## 2015-11-01 DIAGNOSIS — M9903 Segmental and somatic dysfunction of lumbar region: Secondary | ICD-10-CM | POA: Diagnosis not present

## 2015-11-01 DIAGNOSIS — M9902 Segmental and somatic dysfunction of thoracic region: Secondary | ICD-10-CM | POA: Diagnosis not present

## 2015-11-13 DIAGNOSIS — E784 Other hyperlipidemia: Secondary | ICD-10-CM | POA: Diagnosis not present

## 2015-11-13 DIAGNOSIS — E559 Vitamin D deficiency, unspecified: Secondary | ICD-10-CM | POA: Diagnosis not present

## 2015-11-13 DIAGNOSIS — Z0000 Encounter for general adult medical examination without abnormal findings: Secondary | ICD-10-CM | POA: Diagnosis not present

## 2015-11-13 DIAGNOSIS — E538 Deficiency of other specified B group vitamins: Secondary | ICD-10-CM | POA: Diagnosis not present

## 2015-11-20 DIAGNOSIS — E785 Hyperlipidemia, unspecified: Secondary | ICD-10-CM | POA: Diagnosis not present

## 2015-11-20 DIAGNOSIS — Z23 Encounter for immunization: Secondary | ICD-10-CM | POA: Diagnosis not present

## 2015-11-20 DIAGNOSIS — R030 Elevated blood-pressure reading, without diagnosis of hypertension: Secondary | ICD-10-CM | POA: Diagnosis not present

## 2015-11-20 DIAGNOSIS — Z0000 Encounter for general adult medical examination without abnormal findings: Secondary | ICD-10-CM | POA: Diagnosis not present

## 2015-11-20 DIAGNOSIS — Z1389 Encounter for screening for other disorder: Secondary | ICD-10-CM | POA: Diagnosis not present

## 2015-11-20 DIAGNOSIS — G43909 Migraine, unspecified, not intractable, without status migrainosus: Secondary | ICD-10-CM | POA: Diagnosis not present

## 2015-11-20 DIAGNOSIS — Z8249 Family history of ischemic heart disease and other diseases of the circulatory system: Secondary | ICD-10-CM | POA: Diagnosis not present

## 2015-11-21 ENCOUNTER — Encounter: Payer: Self-pay | Admitting: Gastroenterology

## 2015-11-27 DIAGNOSIS — Z1212 Encounter for screening for malignant neoplasm of rectum: Secondary | ICD-10-CM | POA: Diagnosis not present

## 2015-12-12 ENCOUNTER — Ambulatory Visit: Payer: BLUE CROSS/BLUE SHIELD | Admitting: *Deleted

## 2015-12-12 VITALS — Ht 68.0 in | Wt 184.8 lb

## 2015-12-12 DIAGNOSIS — Z1211 Encounter for screening for malignant neoplasm of colon: Secondary | ICD-10-CM

## 2015-12-12 MED ORDER — NA SULFATE-K SULFATE-MG SULF 17.5-3.13-1.6 GM/177ML PO SOLN: 1.0000 | Freq: Once | ORAL | 0 refills | Status: AC

## 2015-12-12 NOTE — Progress Notes (Signed)
Denies allergies to eggs or soy products. Denies complications with sedation or anesthesia. Denies O2 use. Denies use of diet or weight loss medications.  Emmi instructions given for colonoscopy.  

## 2015-12-14 ENCOUNTER — Encounter: Payer: Self-pay | Admitting: Gastroenterology

## 2015-12-27 ENCOUNTER — Encounter: Payer: BLUE CROSS/BLUE SHIELD | Admitting: Gastroenterology

## 2015-12-28 DIAGNOSIS — M9903 Segmental and somatic dysfunction of lumbar region: Secondary | ICD-10-CM | POA: Diagnosis not present

## 2015-12-28 DIAGNOSIS — M791 Myalgia: Secondary | ICD-10-CM | POA: Diagnosis not present

## 2015-12-28 DIAGNOSIS — M9902 Segmental and somatic dysfunction of thoracic region: Secondary | ICD-10-CM | POA: Diagnosis not present

## 2015-12-28 DIAGNOSIS — M9901 Segmental and somatic dysfunction of cervical region: Secondary | ICD-10-CM | POA: Diagnosis not present

## 2016-01-09 DIAGNOSIS — E784 Other hyperlipidemia: Secondary | ICD-10-CM | POA: Diagnosis not present

## 2016-01-31 ENCOUNTER — Ambulatory Visit (AMBULATORY_SURGERY_CENTER): Payer: BLUE CROSS/BLUE SHIELD | Admitting: Gastroenterology

## 2016-01-31 ENCOUNTER — Encounter: Payer: Self-pay | Admitting: Gastroenterology

## 2016-01-31 VITALS — BP 122/73 | HR 79 | Temp 98.9°F | Resp 13 | Ht 68.0 in | Wt 184.0 lb

## 2016-01-31 DIAGNOSIS — Z1212 Encounter for screening for malignant neoplasm of rectum: Secondary | ICD-10-CM

## 2016-01-31 DIAGNOSIS — Z1211 Encounter for screening for malignant neoplasm of colon: Secondary | ICD-10-CM | POA: Diagnosis not present

## 2016-01-31 DIAGNOSIS — D120 Benign neoplasm of cecum: Secondary | ICD-10-CM | POA: Diagnosis not present

## 2016-01-31 DIAGNOSIS — K635 Polyp of colon: Secondary | ICD-10-CM | POA: Diagnosis not present

## 2016-01-31 MED ORDER — SODIUM CHLORIDE 0.9 % IV SOLN: 500.0000 mL | INTRAVENOUS | Status: DC

## 2016-01-31 NOTE — Patient Instructions (Signed)
Colon polyp x 1 removed today. Colon polyp and hemorrhoid handouts given to you. Result letter in your mail in 2-3 weeks.  Resume current medications. Do NOT take any ibuprofen, naproxen, or other non-steriodal anti-inflammatory medications for 2 weeks! Call us with any questions or concerns. Thank you.   YOU HAD AN ENDOSCOPIC PROCEDURE TODAY AT [facility]:   Refer to the procedure report that was given to you for any specific questions about what was found during the examination.  If the procedure report does not answer your questions, please call your gastroenterologist to clarify.  If you requested that your care partner not be given the details of your procedure findings, then the procedure report has been included in a sealed envelope for you to review at your convenience later.  YOU SHOULD EXPECT: Some feelings of bloating in the abdomen. Passage of more gas than usual.  Walking can help get rid of the air that was put into your GI tract during the procedure and reduce the bloating. If you had a lower endoscopy (such as a colonoscopy or flexible sigmoidoscopy) you may notice spotting of blood in your stool or on the toilet paper. If you underwent a bowel prep for your procedure, you may not have a normal bowel movement for a few days.  Please Note:  You might notice some irritation and congestion in your nose or some drainage.  This is from the oxygen used during your procedure.  There is no need for concern and it should clear up in a day or so.  SYMPTOMS TO REPORT IMMEDIATELY:   Following lower endoscopy (colonoscopy or flexible sigmoidoscopy):  Excessive amounts of blood in the stool  Significant tenderness or worsening of abdominal pains  Swelling of the abdomen that is new, acute  Fever of 100F or higher  For urgent or emergent issues, a gastroenterologist can be reached at any hour by calling [phone].   DIET:  We do recommend a small meal at first, but then  you may proceed to your regular diet.  Drink plenty of fluids but you should avoid alcoholic beverages for 24 hours.  ACTIVITY:  You should plan to take it easy for the rest of today and you should NOT DRIVE or use heavy machinery until tomorrow (because of the sedation medicines used during the test).    FOLLOW UP: Our staff will call the number listed on your records the next business day following your procedure to check on you and address any questions or concerns that you may have regarding the information given to you following your procedure. If we do not reach you, we will leave a message.  However, if you are feeling well and you are not experiencing any problems, there is no need to return our call.  We will assume that you have returned to your regular daily activities without incident.  If any biopsies were taken you will be contacted by phone or by letter within the next 1-3 weeks.  Please call us at [phone] if you have not heard about the biopsies in 3 weeks.    SIGNATURES/CONFIDENTIALITY: You and/or your care partner have signed paperwork which will be entered into your electronic medical record.  These signatures attest to the fact that that the information above on your After Visit Summary has been reviewed and is understood.  Full responsibility of the confidentiality of this discharge information lies with you and/or your care-partner.

## 2016-01-31 NOTE — Op Note (Signed)
[place] Patient Name: Elizabeth Harris Procedure Date: 01/31/2016 2:46 PM MRN: [id number] Endoscopist: Remo Lipps P. Arianny Pun MD, MD Age: 50 Referring MD:  Date of Birth: 06/28/1965 Gender: Female Account #: [id number] Procedure:                Colonoscopy Indications:              Screening for colorectal malignant neoplasm, This                            is the patient's first colonoscopy Medicines:                Monitored Anesthesia Care Procedure:                Pre-Anesthesia Assessment:                           - Prior to the procedure, a History and Physical                            was performed, and patient medications and                            allergies were reviewed. The patient's tolerance of                            previous anesthesia was also reviewed. The risks                            and benefits of the procedure and the sedation                            options and risks were discussed with the patient.                            All questions were answered, and informed consent                            was obtained. Prior Anticoagulants: The patient has                            taken no previous anticoagulant or antiplatelet                            agents. ASA Grade Assessment: II - A patient with                            mild systemic disease. After reviewing the risks                            and benefits, the patient was deemed in                            satisfactory condition to undergo the procedure.  After obtaining informed consent, the colonoscope                            was passed under direct vision. Throughout the                            procedure, the patient's blood pressure, pulse, and                            oxygen saturations were monitored continuously. The                            Model CF-HQ190L [id number]) scope was introduced                            through the  anus and advanced to the the cecum,                            identified by appendiceal orifice and ileocecal                            valve. The colonoscopy was performed without                            difficulty. The patient tolerated the procedure                            well. The quality of the bowel preparation was                            good. The ileocecal valve, appendiceal orifice, and                            rectum were photographed. Scope In: 3:02:43 PM Scope Out: 3:18:06 PM Scope Withdrawal Time: 0 hours 12 minutes 14 seconds  Total Procedure Duration: 0 hours 15 minutes 23 seconds  Findings:                 The perianal and digital rectal examinations were                            normal.                           A 6 mm polyp was found in the cecum. The polyp was                            flat. The polyp was removed with a cold snare.                            Resection and retrieval were complete.                           Internal hemorrhoids were found during  retroflexion. The hemorrhoids were small.                           The exam was otherwise without abnormality. Complications:            No immediate complications. Estimated blood loss:                            Minimal. Estimated Blood Loss:     Estimated blood loss was minimal. Impression:               - One 6 mm polyp in the cecum, removed with a cold                            snare. Resected and retrieved.                           - Internal hemorrhoids.                           - The examination was otherwise normal. Recommendation:           - Patient has a contact number available for                            emergencies. The signs and symptoms of potential                            delayed complications were discussed with the                            patient. Return to normal activities tomorrow.                            Written discharge  instructions were provided to the                            patient.                           - Resume previous diet.                           - Continue present medications.                           - No ibuprofen, naproxen, or other non-steroidal                            anti-inflammatory drugs for 2 weeks after polyp                            removal.                           - Await pathology results.                           -  Repeat colonoscopy is recommended for                            surveillance. The colonoscopy date will be                            determined after pathology results from today's                            exam become available for review. Remo Lipps P. Essynce Munsch MD, MD 01/31/2016 3:20:58 PM This report has been signed electronically.

## 2016-01-31 NOTE — Progress Notes (Signed)
A and O x3. Report to RN. Tolerated MAC anesthesia well. 

## 2016-01-31 NOTE — Progress Notes (Signed)
Called to room to assist during endoscopic procedure.  Patient ID and intended procedure confirmed with present staff. Received instructions for my participation in the procedure from the performing physician.  

## 2016-02-01 ENCOUNTER — Telehealth: Payer: Self-pay | Admitting: *Deleted

## 2016-02-01 NOTE — Telephone Encounter (Signed)
  Follow up Call-  Call back number 01/31/2016  Post procedure Call Back phone  # [phone]  Permission to leave phone message Yes  Some recent data might be hidden     Patient questions:  Do you have a fever, pain , or abdominal swelling? No. Pain Score  0 *  Have you tolerated food without any problems? Yes.    Have you been able to return to your normal activities? Yes.    Do you have any questions about your discharge instructions: Diet   No. Medications  No. Follow up visit  No.  Do you have questions or concerns about your Care? No.  Actions: * If pain score is 4 or above: No action needed, pain <4.

## 2016-02-07 ENCOUNTER — Encounter: Payer: Self-pay | Admitting: Gastroenterology

## 2016-02-27 DIAGNOSIS — H1045 Other chronic allergic conjunctivitis: Secondary | ICD-10-CM | POA: Diagnosis not present

## 2016-02-27 DIAGNOSIS — J3081 Allergic rhinitis due to animal (cat) (dog) hair and dander: Secondary | ICD-10-CM | POA: Diagnosis not present

## 2016-02-27 DIAGNOSIS — J3089 Other allergic rhinitis: Secondary | ICD-10-CM | POA: Diagnosis not present

## 2016-02-27 DIAGNOSIS — J301 Allergic rhinitis due to pollen: Secondary | ICD-10-CM | POA: Diagnosis not present

## 2016-03-01 DIAGNOSIS — M791 Myalgia: Secondary | ICD-10-CM | POA: Diagnosis not present

## 2016-03-01 DIAGNOSIS — M9903 Segmental and somatic dysfunction of lumbar region: Secondary | ICD-10-CM | POA: Diagnosis not present

## 2016-03-01 DIAGNOSIS — M9901 Segmental and somatic dysfunction of cervical region: Secondary | ICD-10-CM | POA: Diagnosis not present

## 2016-03-01 DIAGNOSIS — M9902 Segmental and somatic dysfunction of thoracic region: Secondary | ICD-10-CM | POA: Diagnosis not present

## 2016-06-12 DIAGNOSIS — Z01419 Encounter for gynecological examination (general) (routine) without abnormal findings: Secondary | ICD-10-CM | POA: Diagnosis not present

## 2016-06-12 DIAGNOSIS — Z6827 Body mass index (BMI) 27.0-27.9, adult: Secondary | ICD-10-CM | POA: Diagnosis not present

## 2016-06-12 DIAGNOSIS — Z1231 Encounter for screening mammogram for malignant neoplasm of breast: Secondary | ICD-10-CM | POA: Diagnosis not present

## 2016-11-18 DIAGNOSIS — E784 Other hyperlipidemia: Secondary | ICD-10-CM | POA: Diagnosis not present

## 2016-11-18 DIAGNOSIS — E559 Vitamin D deficiency, unspecified: Secondary | ICD-10-CM | POA: Diagnosis not present

## 2016-11-18 DIAGNOSIS — Z0000 Encounter for general adult medical examination without abnormal findings: Secondary | ICD-10-CM | POA: Diagnosis not present

## 2016-11-18 DIAGNOSIS — E538 Deficiency of other specified B group vitamins: Secondary | ICD-10-CM | POA: Diagnosis not present

## 2016-11-25 DIAGNOSIS — E7849 Other hyperlipidemia: Secondary | ICD-10-CM | POA: Diagnosis not present

## 2016-11-25 DIAGNOSIS — Z0000 Encounter for general adult medical examination without abnormal findings: Secondary | ICD-10-CM | POA: Diagnosis not present

## 2016-11-25 DIAGNOSIS — G43909 Migraine, unspecified, not intractable, without status migrainosus: Secondary | ICD-10-CM | POA: Diagnosis not present

## 2016-11-25 DIAGNOSIS — Z8249 Family history of ischemic heart disease and other diseases of the circulatory system: Secondary | ICD-10-CM | POA: Diagnosis not present

## 2016-11-25 DIAGNOSIS — Z1389 Encounter for screening for other disorder: Secondary | ICD-10-CM | POA: Diagnosis not present

## 2016-11-25 DIAGNOSIS — Z780 Asymptomatic menopausal state: Secondary | ICD-10-CM | POA: Diagnosis not present

## 2016-11-25 HISTORY — PX: CHOLECYSTECTOMY: SHX55

## 2016-11-26 DIAGNOSIS — Z1212 Encounter for screening for malignant neoplasm of rectum: Secondary | ICD-10-CM | POA: Diagnosis not present

## 2016-11-26 LAB — IFOBT (OCCULT BLOOD): IFOBT: POSITIVE

## 2016-11-28 ENCOUNTER — Telehealth: Payer: Self-pay | Admitting: Gastroenterology

## 2016-11-28 NOTE — Telephone Encounter (Signed)
Patient has just recently started increasing her exercise, weight lifting, diet, feels + stool test was more from hemorrhoids. She is not anxious about the hemosure test results, it was done at her yearly physical and back pain related to overexertion at the gym. She understands that if she has changes in bowel habits, abdominal pain, blood in stool that she needs to contact our office. Otherwise, she understands that next colonoscopy is in 01/2021, for 5 year recall. She declined to come in to discuss.

## 2016-11-28 NOTE — Telephone Encounter (Signed)
Received labwork from Dr. Hoover Brunette office for this patient  She apparently had a positive hemosure on 11/26/16. I'm not sure why this was done, she just had a colonoscopy less than a year ago with one small polyp removed and hemorrhoids. We usually do colonoscopy OR stool testing, but not both. I suspect the hemosure could be positive from hemorrhoids, I don't think anything significant is causing this, but she should see me in clinic to discuss this to make sure she understands. Thanks

## 2016-12-09 DIAGNOSIS — Z79899 Other long term (current) drug therapy: Secondary | ICD-10-CM | POA: Diagnosis not present

## 2016-12-09 DIAGNOSIS — Z8249 Family history of ischemic heart disease and other diseases of the circulatory system: Secondary | ICD-10-CM | POA: Diagnosis not present

## 2016-12-09 DIAGNOSIS — R1011 Right upper quadrant pain: Secondary | ICD-10-CM | POA: Diagnosis not present

## 2016-12-09 DIAGNOSIS — G43909 Migraine, unspecified, not intractable, without status migrainosus: Secondary | ICD-10-CM | POA: Diagnosis not present

## 2016-12-09 DIAGNOSIS — R1031 Right lower quadrant pain: Secondary | ICD-10-CM | POA: Diagnosis not present

## 2016-12-09 DIAGNOSIS — K8012 Calculus of gallbladder with acute and chronic cholecystitis without obstruction: Secondary | ICD-10-CM | POA: Diagnosis not present

## 2016-12-09 DIAGNOSIS — K8020 Calculus of gallbladder without cholecystitis without obstruction: Secondary | ICD-10-CM | POA: Diagnosis not present

## 2016-12-09 DIAGNOSIS — K8000 Calculus of gallbladder with acute cholecystitis without obstruction: Secondary | ICD-10-CM | POA: Diagnosis not present

## 2016-12-09 DIAGNOSIS — Z833 Family history of diabetes mellitus: Secondary | ICD-10-CM | POA: Diagnosis not present

## 2016-12-10 DIAGNOSIS — K8000 Calculus of gallbladder with acute cholecystitis without obstruction: Secondary | ICD-10-CM | POA: Diagnosis not present

## 2016-12-10 DIAGNOSIS — K810 Acute cholecystitis: Secondary | ICD-10-CM | POA: Diagnosis not present

## 2016-12-10 DIAGNOSIS — K8012 Calculus of gallbladder with acute and chronic cholecystitis without obstruction: Secondary | ICD-10-CM | POA: Diagnosis not present

## 2016-12-11 DIAGNOSIS — K8000 Calculus of gallbladder with acute cholecystitis without obstruction: Secondary | ICD-10-CM | POA: Diagnosis not present

## 2016-12-26 DIAGNOSIS — M25512 Pain in left shoulder: Secondary | ICD-10-CM | POA: Diagnosis not present

## 2016-12-26 DIAGNOSIS — E785 Hyperlipidemia, unspecified: Secondary | ICD-10-CM | POA: Diagnosis not present

## 2016-12-26 DIAGNOSIS — R079 Chest pain, unspecified: Secondary | ICD-10-CM | POA: Diagnosis not present

## 2016-12-26 DIAGNOSIS — R0789 Other chest pain: Secondary | ICD-10-CM | POA: Diagnosis not present

## 2016-12-26 DIAGNOSIS — Z9049 Acquired absence of other specified parts of digestive tract: Secondary | ICD-10-CM | POA: Diagnosis not present

## 2016-12-26 DIAGNOSIS — Z90711 Acquired absence of uterus with remaining cervical stump: Secondary | ICD-10-CM | POA: Diagnosis not present

## 2016-12-26 DIAGNOSIS — K219 Gastro-esophageal reflux disease without esophagitis: Secondary | ICD-10-CM | POA: Diagnosis not present

## 2016-12-26 DIAGNOSIS — Z8249 Family history of ischemic heart disease and other diseases of the circulatory system: Secondary | ICD-10-CM | POA: Diagnosis not present

## 2016-12-26 DIAGNOSIS — Z79899 Other long term (current) drug therapy: Secondary | ICD-10-CM | POA: Diagnosis not present

## 2016-12-27 DIAGNOSIS — R079 Chest pain, unspecified: Secondary | ICD-10-CM | POA: Diagnosis not present

## 2017-01-09 DIAGNOSIS — K219 Gastro-esophageal reflux disease without esophagitis: Secondary | ICD-10-CM | POA: Diagnosis not present

## 2017-01-09 DIAGNOSIS — Z6824 Body mass index (BMI) 24.0-24.9, adult: Secondary | ICD-10-CM | POA: Diagnosis not present

## 2017-01-09 DIAGNOSIS — Z9089 Acquired absence of other organs: Secondary | ICD-10-CM | POA: Diagnosis not present

## 2017-01-09 DIAGNOSIS — E7849 Other hyperlipidemia: Secondary | ICD-10-CM | POA: Diagnosis not present

## 2017-01-25 DIAGNOSIS — K219 Gastro-esophageal reflux disease without esophagitis: Secondary | ICD-10-CM | POA: Diagnosis not present

## 2017-01-25 DIAGNOSIS — Z6824 Body mass index (BMI) 24.0-24.9, adult: Secondary | ICD-10-CM | POA: Diagnosis not present

## 2017-01-25 DIAGNOSIS — Z9089 Acquired absence of other organs: Secondary | ICD-10-CM | POA: Diagnosis not present

## 2017-01-25 DIAGNOSIS — E7849 Other hyperlipidemia: Secondary | ICD-10-CM | POA: Diagnosis not present

## 2017-01-30 DIAGNOSIS — Z6824 Body mass index (BMI) 24.0-24.9, adult: Secondary | ICD-10-CM | POA: Diagnosis not present

## 2017-01-30 DIAGNOSIS — M25519 Pain in unspecified shoulder: Secondary | ICD-10-CM | POA: Diagnosis not present

## 2017-05-27 DIAGNOSIS — J301 Allergic rhinitis due to pollen: Secondary | ICD-10-CM | POA: Diagnosis not present

## 2017-05-27 DIAGNOSIS — H1045 Other chronic allergic conjunctivitis: Secondary | ICD-10-CM | POA: Diagnosis not present

## 2017-05-27 DIAGNOSIS — J3089 Other allergic rhinitis: Secondary | ICD-10-CM | POA: Diagnosis not present

## 2017-05-27 DIAGNOSIS — J3081 Allergic rhinitis due to animal (cat) (dog) hair and dander: Secondary | ICD-10-CM | POA: Diagnosis not present

## 2017-06-27 DIAGNOSIS — M67912 Unspecified disorder of synovium and tendon, left shoulder: Secondary | ICD-10-CM | POA: Diagnosis not present

## 2017-07-08 DIAGNOSIS — R293 Abnormal posture: Secondary | ICD-10-CM | POA: Diagnosis not present

## 2017-07-08 DIAGNOSIS — R531 Weakness: Secondary | ICD-10-CM | POA: Diagnosis not present

## 2017-07-08 DIAGNOSIS — M25512 Pain in left shoulder: Secondary | ICD-10-CM | POA: Diagnosis not present

## 2017-07-10 DIAGNOSIS — R531 Weakness: Secondary | ICD-10-CM | POA: Diagnosis not present

## 2017-07-10 DIAGNOSIS — M25512 Pain in left shoulder: Secondary | ICD-10-CM | POA: Diagnosis not present

## 2017-07-10 DIAGNOSIS — R293 Abnormal posture: Secondary | ICD-10-CM | POA: Diagnosis not present

## 2017-07-23 DIAGNOSIS — R293 Abnormal posture: Secondary | ICD-10-CM | POA: Diagnosis not present

## 2017-07-23 DIAGNOSIS — R531 Weakness: Secondary | ICD-10-CM | POA: Diagnosis not present

## 2017-07-23 DIAGNOSIS — M25512 Pain in left shoulder: Secondary | ICD-10-CM | POA: Diagnosis not present

## 2017-07-24 DIAGNOSIS — R1031 Right lower quadrant pain: Secondary | ICD-10-CM | POA: Diagnosis not present

## 2017-07-24 DIAGNOSIS — R1032 Left lower quadrant pain: Secondary | ICD-10-CM | POA: Diagnosis not present

## 2017-07-24 DIAGNOSIS — R031 Nonspecific low blood-pressure reading: Secondary | ICD-10-CM | POA: Diagnosis not present

## 2017-07-24 DIAGNOSIS — D649 Anemia, unspecified: Secondary | ICD-10-CM | POA: Diagnosis not present

## 2017-07-24 DIAGNOSIS — E785 Hyperlipidemia, unspecified: Secondary | ICD-10-CM | POA: Diagnosis not present

## 2017-08-07 DIAGNOSIS — M25512 Pain in left shoulder: Secondary | ICD-10-CM | POA: Diagnosis not present

## 2017-08-07 DIAGNOSIS — R293 Abnormal posture: Secondary | ICD-10-CM | POA: Diagnosis not present

## 2017-08-07 DIAGNOSIS — R531 Weakness: Secondary | ICD-10-CM | POA: Diagnosis not present

## 2017-08-11 DIAGNOSIS — R531 Weakness: Secondary | ICD-10-CM | POA: Diagnosis not present

## 2017-08-11 DIAGNOSIS — R293 Abnormal posture: Secondary | ICD-10-CM | POA: Diagnosis not present

## 2017-08-11 DIAGNOSIS — M25512 Pain in left shoulder: Secondary | ICD-10-CM | POA: Diagnosis not present

## 2017-09-03 DIAGNOSIS — N959 Unspecified menopausal and perimenopausal disorder: Secondary | ICD-10-CM | POA: Diagnosis not present

## 2017-09-03 DIAGNOSIS — Z6824 Body mass index (BMI) 24.0-24.9, adult: Secondary | ICD-10-CM | POA: Diagnosis not present

## 2017-09-05 DIAGNOSIS — M67912 Unspecified disorder of synovium and tendon, left shoulder: Secondary | ICD-10-CM | POA: Diagnosis not present

## 2017-09-29 DIAGNOSIS — Z6824 Body mass index (BMI) 24.0-24.9, adult: Secondary | ICD-10-CM | POA: Diagnosis not present

## 2017-09-29 DIAGNOSIS — Z1231 Encounter for screening mammogram for malignant neoplasm of breast: Secondary | ICD-10-CM | POA: Diagnosis not present

## 2017-09-29 DIAGNOSIS — Z803 Family history of malignant neoplasm of breast: Secondary | ICD-10-CM | POA: Diagnosis not present

## 2017-09-29 DIAGNOSIS — Z01419 Encounter for gynecological examination (general) (routine) without abnormal findings: Secondary | ICD-10-CM | POA: Diagnosis not present

## 2017-09-29 DIAGNOSIS — Z86010 Personal history of colonic polyps: Secondary | ICD-10-CM | POA: Diagnosis not present

## 2017-09-29 DIAGNOSIS — Z800 Family history of malignant neoplasm of digestive organs: Secondary | ICD-10-CM | POA: Diagnosis not present

## 2017-11-12 DIAGNOSIS — Z7183 Encounter for nonprocreative genetic counseling: Secondary | ICD-10-CM | POA: Diagnosis not present

## 2017-11-12 DIAGNOSIS — Z6824 Body mass index (BMI) 24.0-24.9, adult: Secondary | ICD-10-CM | POA: Diagnosis not present

## 2017-11-12 DIAGNOSIS — N959 Unspecified menopausal and perimenopausal disorder: Secondary | ICD-10-CM | POA: Diagnosis not present

## 2017-11-19 DIAGNOSIS — E559 Vitamin D deficiency, unspecified: Secondary | ICD-10-CM | POA: Diagnosis not present

## 2017-11-19 DIAGNOSIS — Z0000 Encounter for general adult medical examination without abnormal findings: Secondary | ICD-10-CM | POA: Diagnosis not present

## 2017-11-19 DIAGNOSIS — R946 Abnormal results of thyroid function studies: Secondary | ICD-10-CM | POA: Diagnosis not present

## 2017-11-19 DIAGNOSIS — E7849 Other hyperlipidemia: Secondary | ICD-10-CM | POA: Diagnosis not present

## 2017-11-19 DIAGNOSIS — E538 Deficiency of other specified B group vitamins: Secondary | ICD-10-CM | POA: Diagnosis not present

## 2017-11-26 DIAGNOSIS — Z8249 Family history of ischemic heart disease and other diseases of the circulatory system: Secondary | ICD-10-CM | POA: Diagnosis not present

## 2017-11-26 DIAGNOSIS — Z1389 Encounter for screening for other disorder: Secondary | ICD-10-CM | POA: Diagnosis not present

## 2017-11-26 DIAGNOSIS — Z0000 Encounter for general adult medical examination without abnormal findings: Secondary | ICD-10-CM | POA: Diagnosis not present

## 2017-11-26 DIAGNOSIS — N951 Menopausal and female climacteric states: Secondary | ICD-10-CM | POA: Diagnosis not present

## 2017-11-26 DIAGNOSIS — K219 Gastro-esophageal reflux disease without esophagitis: Secondary | ICD-10-CM | POA: Diagnosis not present

## 2017-11-26 DIAGNOSIS — E7849 Other hyperlipidemia: Secondary | ICD-10-CM | POA: Diagnosis not present

## 2017-11-26 DIAGNOSIS — Z23 Encounter for immunization: Secondary | ICD-10-CM | POA: Diagnosis not present

## 2017-12-03 DIAGNOSIS — N951 Menopausal and female climacteric states: Secondary | ICD-10-CM | POA: Diagnosis not present

## 2017-12-05 DIAGNOSIS — R5383 Other fatigue: Secondary | ICD-10-CM | POA: Diagnosis not present

## 2017-12-05 DIAGNOSIS — N951 Menopausal and female climacteric states: Secondary | ICD-10-CM | POA: Diagnosis not present

## 2017-12-05 DIAGNOSIS — R232 Flushing: Secondary | ICD-10-CM | POA: Diagnosis not present

## 2017-12-05 DIAGNOSIS — Z72820 Sleep deprivation: Secondary | ICD-10-CM | POA: Diagnosis not present

## 2017-12-09 DIAGNOSIS — Z1212 Encounter for screening for malignant neoplasm of rectum: Secondary | ICD-10-CM | POA: Diagnosis not present

## 2018-01-02 DIAGNOSIS — R232 Flushing: Secondary | ICD-10-CM | POA: Diagnosis not present

## 2018-01-02 DIAGNOSIS — N951 Menopausal and female climacteric states: Secondary | ICD-10-CM | POA: Diagnosis not present

## 2018-01-02 DIAGNOSIS — R5383 Other fatigue: Secondary | ICD-10-CM | POA: Diagnosis not present

## 2018-01-02 DIAGNOSIS — Z72820 Sleep deprivation: Secondary | ICD-10-CM | POA: Diagnosis not present

## 2018-01-07 DIAGNOSIS — R5383 Other fatigue: Secondary | ICD-10-CM | POA: Diagnosis not present

## 2018-01-07 DIAGNOSIS — Z72820 Sleep deprivation: Secondary | ICD-10-CM | POA: Diagnosis not present

## 2018-01-07 DIAGNOSIS — R232 Flushing: Secondary | ICD-10-CM | POA: Diagnosis not present

## 2018-01-07 DIAGNOSIS — N951 Menopausal and female climacteric states: Secondary | ICD-10-CM | POA: Diagnosis not present

## 2018-04-01 ENCOUNTER — Other Ambulatory Visit: Payer: Self-pay | Admitting: Obstetrics and Gynecology

## 2018-04-01 DIAGNOSIS — Z9189 Other specified personal risk factors, not elsewhere classified: Secondary | ICD-10-CM

## 2018-04-06 DIAGNOSIS — R5383 Other fatigue: Secondary | ICD-10-CM | POA: Diagnosis not present

## 2018-04-06 DIAGNOSIS — N951 Menopausal and female climacteric states: Secondary | ICD-10-CM | POA: Diagnosis not present

## 2018-04-06 DIAGNOSIS — Z72820 Sleep deprivation: Secondary | ICD-10-CM | POA: Diagnosis not present

## 2018-04-06 DIAGNOSIS — R232 Flushing: Secondary | ICD-10-CM | POA: Diagnosis not present

## 2018-04-10 ENCOUNTER — Other Ambulatory Visit: Payer: Self-pay | Admitting: Obstetrics and Gynecology

## 2018-04-13 ENCOUNTER — Inpatient Hospital Stay: Admission: RE | Admit: 2018-04-13 | Payer: BLUE CROSS/BLUE SHIELD | Source: Ambulatory Visit

## 2018-04-20 ENCOUNTER — Ambulatory Visit
Admission: RE | Admit: 2018-04-20 | Discharge: 2018-04-20 | Disposition: A | Discharge status: Home / Self Care | Payer: BLUE CROSS/BLUE SHIELD | Source: Ambulatory Visit | Attending: Obstetrics and Gynecology | Admitting: Obstetrics and Gynecology

## 2018-04-20 DIAGNOSIS — Z72820 Sleep deprivation: Secondary | ICD-10-CM | POA: Diagnosis not present

## 2018-04-20 DIAGNOSIS — N951 Menopausal and female climacteric states: Secondary | ICD-10-CM | POA: Diagnosis not present

## 2018-04-20 DIAGNOSIS — R232 Flushing: Secondary | ICD-10-CM | POA: Diagnosis not present

## 2018-04-20 DIAGNOSIS — G4700 Insomnia, unspecified: Secondary | ICD-10-CM | POA: Diagnosis not present

## 2018-04-20 DIAGNOSIS — N6489 Other specified disorders of breast: Secondary | ICD-10-CM | POA: Diagnosis not present

## 2018-04-20 DIAGNOSIS — Z9189 Other specified personal risk factors, not elsewhere classified: Secondary | ICD-10-CM

## 2018-04-20 MED ORDER — GADOBUTROL 1 MMOL/ML IV SOLN
7.0000 mL | Freq: Once | INTRAVENOUS | Status: AC | PRN
  Administered 2018-04-20: 7 mL via INTRAVENOUS

## 2018-05-18 DIAGNOSIS — J301 Allergic rhinitis due to pollen: Secondary | ICD-10-CM | POA: Diagnosis not present

## 2018-05-18 DIAGNOSIS — J3089 Other allergic rhinitis: Secondary | ICD-10-CM | POA: Diagnosis not present

## 2018-05-18 DIAGNOSIS — J3081 Allergic rhinitis due to animal (cat) (dog) hair and dander: Secondary | ICD-10-CM | POA: Diagnosis not present

## 2018-05-18 DIAGNOSIS — H1045 Other chronic allergic conjunctivitis: Secondary | ICD-10-CM | POA: Diagnosis not present

## 2018-07-21 DIAGNOSIS — R5383 Other fatigue: Secondary | ICD-10-CM | POA: Diagnosis not present

## 2018-07-21 DIAGNOSIS — N951 Menopausal and female climacteric states: Secondary | ICD-10-CM | POA: Diagnosis not present

## 2018-07-21 DIAGNOSIS — R232 Flushing: Secondary | ICD-10-CM | POA: Diagnosis not present

## 2018-07-24 DIAGNOSIS — Z72820 Sleep deprivation: Secondary | ICD-10-CM | POA: Diagnosis not present

## 2018-07-24 DIAGNOSIS — N951 Menopausal and female climacteric states: Secondary | ICD-10-CM | POA: Diagnosis not present

## 2018-07-24 DIAGNOSIS — R6882 Decreased libido: Secondary | ICD-10-CM | POA: Diagnosis not present

## 2018-12-02 ENCOUNTER — Ambulatory Visit (INDEPENDENT_AMBULATORY_CARE_PROVIDER_SITE_OTHER): Payer: BC Managed Care – PPO | Admitting: Family Medicine

## 2018-12-02 ENCOUNTER — Encounter: Payer: Self-pay | Admitting: Family Medicine

## 2018-12-02 ENCOUNTER — Other Ambulatory Visit: Payer: Self-pay

## 2018-12-02 VITALS — BP 125/79 | HR 66 | Temp 97.7°F | Ht 68.0 in | Wt 184.2 lb

## 2018-12-02 DIAGNOSIS — Z803 Family history of malignant neoplasm of breast: Secondary | ICD-10-CM

## 2018-12-02 DIAGNOSIS — Z23 Encounter for immunization: Secondary | ICD-10-CM | POA: Diagnosis not present

## 2018-12-02 DIAGNOSIS — Z130 Encounter for screening for diseases of the blood and blood-forming organs and certain disorders involving the immune mechanism: Secondary | ICD-10-CM

## 2018-12-02 DIAGNOSIS — E785 Hyperlipidemia, unspecified: Secondary | ICD-10-CM | POA: Diagnosis not present

## 2018-12-02 DIAGNOSIS — E539 Vitamin B deficiency, unspecified: Secondary | ICD-10-CM

## 2018-12-02 DIAGNOSIS — Z780 Asymptomatic menopausal state: Secondary | ICD-10-CM | POA: Insufficient documentation

## 2018-12-02 DIAGNOSIS — G43119 Migraine with aura, intractable, without status migrainosus: Secondary | ICD-10-CM | POA: Diagnosis not present

## 2018-12-02 DIAGNOSIS — E559 Vitamin D deficiency, unspecified: Secondary | ICD-10-CM

## 2018-12-02 DIAGNOSIS — J302 Other seasonal allergic rhinitis: Secondary | ICD-10-CM

## 2018-12-02 DIAGNOSIS — E663 Overweight: Secondary | ICD-10-CM | POA: Insufficient documentation

## 2018-12-02 MED ORDER — SUMATRIPTAN SUCCINATE 6 MG/0.5ML ~~LOC~~ SOLN: 6.0000 mg | SUBCUTANEOUS | 2 refills | Status: DC | PRN

## 2018-12-02 MED ORDER — ROSUVASTATIN CALCIUM 10 MG PO TABS: 10.0000 mg | ORAL_TABLET | Freq: Every evening | ORAL | 3 refills | Status: DC

## 2018-12-02 MED ORDER — LEVOCETIRIZINE DIHYDROCHLORIDE 5 MG PO TABS: 5.0000 mg | ORAL_TABLET | Freq: Every evening | ORAL | 3 refills | Status: DC

## 2018-12-02 MED ORDER — SHINGRIX 50 MCG/0.5ML IM SUSR: 0.5000 mL | Freq: Once | INTRAMUSCULAR | 0 refills | Status: AC

## 2018-12-02 MED ORDER — FLUTICASONE PROPIONATE 50 MCG/ACT NA SUSP: 2.0000 | Freq: Every day | NASAL | 6 refills | Status: AC

## 2018-12-02 NOTE — Patient Instructions (Signed)

## 2018-12-02 NOTE — Progress Notes (Signed)
New Patient Office Visit  Assessment & Plan:  1. Intractable migraine with aura without status migrainosus - Well controlled on current regimen.  - SUMAtriptan (IMITREX) 6 MG/0.5ML SOLN injection; Inject 0.5 mLs (6 mg total) into the skin every hour as needed for migraine (max 12 mg in 24 hours).  Dispense: 4 mL; Refill: 2  2. Hyperlipidemia, unspecified hyperlipidemia type - Labs today to assess for control.  - rosuvastatin (CRESTOR) 10 MG tablet; Take 1 tablet (10 mg total) by mouth every evening.  Dispense: 90 tablet; Refill: 3 - CMP14+EGFR - Lipid panel  3. Vitamin B deficiency - Taking OTC vitamin B supplement.  4. Vitamin D deficiency - Taking OCT vitamin D supplement.   5. Seasonal allergies - Well controlled on current regimen.  - levocetirizine (XYZAL) 5 MG tablet; Take 1 tablet (5 mg total) by mouth every evening.  Dispense: 90 tablet; Refill: 3 - fluticasone (FLONASE) 50 MCG/ACT nasal spray; Place 2 sprays into both nostrils daily.  Dispense: 16 g; Refill: 6  6. Overweight - Encouraged healthy eating and exercise outside of work. Exercise goal is 30 minutes five days a week.   7. Family history of breast cancer in female - Continue yearly breast MRI alternating every six months with yearly mammograms. Mammogram to be completed on the bus here at the office.   8. Menopause - Well controlled on current regimen. Continue medications and keep follow-ups with [facility] MD.   9. Screening for deficiency anemia - CBC with Differential/Platelet  10. Need for immunization against influenza - Flu Vaccine QUAD 36+ mos IM  11. Immunization due - SHINGRIX injection; Inject 0.5 mLs into the muscle once for 1 dose.  Dispense: 0.5 mL; Refill: 0   Follow-up: Return in about 1 year (around 12/02/2019) for annual physical.   Hendricks Limes, MSN, APRN, FNP-C [facility]  Subjective:  Patient ID: Elizabeth Harris, female    DOB: [birth year]  Age: 53 y.o.  MRN: [id number]  Patient Care Team: Loman Brooklyn, FNP as PCP - General (Family Medicine) Vanessa Kick, MD as Consulting Physician (Obstetrics and Gynecology)  CC:  Chief Complaint  Patient presents with  . New Patient (Initial Visit)    HPI Elizabeth Harris presents to establish care. She is transferring care from [facility] as she has recently moved.   Patient has a family history of breast cancer in her maternal grandmother. She has completed genetic screening and reports she is at high risk. She therefore has increased frequency of screening for breast cancer. She had a breast MRI on 04/20/2018 which found no evidence of breast malignancy. It was recommended she have another breast MRI in 1 year and have a screening mammogram in 6 months to resume annual mammogram schedule. She has not had the mammogram yet but is interested.   Patient reports she has a long history of migraines which have been related to her hormones. She goes to [facility] MD for management of menopause symptoms which includes progesterone and testosterone. Since she has been getting these hormones regulated the migraines have improved. When she does get a migraine, it is relieved with Imitrex subcutaneously.   Patient reports she is due for lab work.   She reports she tried a weight loss program a few years ago which was great; she lost 31 lbs. She has gained the weight back but does not wish to participate in any programs. She would like to lose weight the "natural way". She does  not eat like she should and she is not currently exercising outside of walking at work.   Taking Flonase and Xyzal for her allergies which works well for her.    Review of Systems  Constitutional: Negative for chills, fever, malaise/fatigue and weight loss.  HENT: Negative for congestion, ear discharge, ear pain, nosebleeds, sinus pain, sore throat and tinnitus.   Eyes: Negative for blurred vision, double vision, pain, discharge  and redness.  Respiratory: Negative for cough, shortness of breath and wheezing.   Cardiovascular: Negative for chest pain, palpitations and leg swelling.  Gastrointestinal: Negative for abdominal pain, constipation, diarrhea, heartburn, nausea and vomiting.  Genitourinary: Negative for dysuria, frequency and urgency.  Musculoskeletal: Negative for myalgias.  Skin: Negative for rash.  Neurological: Positive for headaches. Negative for dizziness, seizures and weakness.  Psychiatric/Behavioral: Negative for depression, substance abuse and suicidal ideas. The patient is not nervous/anxious.     Current Outpatient Medications:  .  Cyanocobalamin (VITAMIN B-12 PO), Take by mouth., Disp: , Rfl:  .  fluticasone (FLONASE) 50 MCG/ACT nasal spray, Place 2 sprays into both nostrils daily., Disp: 16 g, Rfl: 6 .  levocetirizine (XYZAL) 5 MG tablet, Take 1 tablet (5 mg total) by mouth every evening., Disp: 90 tablet, Rfl: 3 .  Multiple Vitamins-Minerals (MULTI-VITAMIN GUMMIES PO), Take by mouth daily., Disp: , Rfl:  .  progesterone (PROMETRIUM) 100 MG capsule, Take 150 mg by mouth daily., Disp: , Rfl:  .  rosuvastatin (CRESTOR) 10 MG tablet, Take 1 tablet (10 mg total) by mouth every evening., Disp: 90 tablet, Rfl: 3 .  SUMAtriptan (IMITREX) 6 MG/0.5ML SOLN injection, Inject 0.5 mLs (6 mg total) into the skin every hour as needed for migraine (max 12 mg in 24 hours)., Disp: 4 mL, Rfl: 2 .  Testosterone 25 MG PLLT, 125 mg by Implant route., Disp: , Rfl:  .  VITAMIN D, CHOLECALCIFEROL, PO, Take 2,000 mg by mouth daily., Disp: , Rfl:  .  SHINGRIX injection, Inject 0.5 mLs into the muscle once for 1 dose., Disp: 0.5 mL, Rfl: 0  No Known Allergies  Past Medical History:  Diagnosis Date  . Allergy   . Arthritis   . Hyperlipidemia   . Migraine   . Vitamin B deficiency   . Vitamin D deficiency     Past Surgical History:  Procedure Laterality Date  . CHOLECYSTECTOMY  11/2016  . PARTIAL HYSTERECTOMY      due to uterine prolapse. removed cervix    Family History  Problem Relation Age of Onset  . Hypertension Mother   . Migraines Mother   . Diabetes Father   . Heart disease Father   . Migraines Sister   . Migraines Maternal Grandmother   . Breast cancer Paternal Grandmother   . Migraines Sister   . Autism Son   . Colon cancer Neg Hx     Social History   Socioeconomic History  . Marital status: Married    Spouse name: Venida Tsukamoto  . Number of children: 1  . Years of education: 4  . Highest education level: Bachelor's degree (e.g., BA, AB, BS)  Occupational History  . 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    Employer: [employer]  Social Needs  . Financial resource strain: Not on file  . Food insecurity    Worry: Not on file    Inability: Not on file  . Transportation needs    Medical: Not on file    Non-medical: Not on file  Tobacco Use  . Smoking status: Never Smoker  . Smokeless tobacco: Never Used  Substance and Sexual Activity  . Alcohol use: No  . Drug use: No  . Sexual activity: Yes  Lifestyle  . Physical activity    Days per week: Not on file    Minutes per session: Not on file  . Stress: Not on file  Relationships  . Social Herbalist on phone: Not on file    Gets together: Not on file    Attends religious service: Not on file    Active member of club or organization: Not on file    Attends meetings of clubs or organizations: Not on file    Relationship status: Not on file  . Intimate partner violence    Fear of current or ex partner: Not on file    Emotionally abused: Not on file    Physically abused: Not on file    Forced sexual activity: Not on file  Other Topics Concern  . 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     Lives with her husband.   Her son Deedra Ehrich lives with them. His children, Gerald Stabs and Mickel Baas, also live with them.     Objective:   Today's Vitals: BP 125/79   Pulse 66   Temp 97.7 F (36.5 C) (Temporal)   Ht 5' 8"  (1.727 m)   Wt 184 lb 3.2 oz (83.6 kg)   SpO2 98%   BMI 28.01 kg/m   Physical Exam Vitals signs reviewed.  Constitutional:      General: She is not in acute distress.    Appearance: Normal appearance. She is overweight. She is not ill-appearing, toxic-appearing or diaphoretic.  HENT:     Head: Normocephalic and atraumatic.  Eyes:     General: No scleral icterus.       Right eye: No discharge.        Left eye: No discharge.     Conjunctiva/sclera: Conjunctivae normal.  Neck:     Musculoskeletal: Normal range of motion.  Cardiovascular:     Rate and Rhythm: Normal rate and regular rhythm.     Heart sounds: Normal heart sounds. No murmur. No friction rub. No gallop.   Pulmonary:     Effort: Pulmonary effort is normal. No respiratory distress.     Breath sounds: Normal breath sounds. No stridor. No wheezing, rhonchi or rales.  Musculoskeletal: Normal range of motion.  Skin:    General: Skin is warm and dry.     Capillary Refill: Capillary refill takes less than 2 seconds.  Neurological:     General: No focal deficit present.     Mental Status: She is alert and oriented to person, place, and time. Mental status is at baseline.  Psychiatric:        Mood and Affect: Mood normal.        Behavior: Behavior normal.        Thought Content: Thought content normal.        Judgment: Judgment normal.

## 2018-12-03 LAB — CMP14+EGFR
ALT: 12 IU/L (ref 0–32)
AST: 17 IU/L (ref 0–40)
Albumin/Globulin Ratio: 1.7 (ref 1.2–2.2)
Albumin: 4.5 g/dL (ref 3.8–4.9)
Alkaline Phosphatase: 73 IU/L (ref 39–117)
BUN/Creatinine Ratio: 14 (ref 9–23)
BUN: 11 mg/dL (ref 6–24)
Bilirubin Total: 1.5 mg/dL — ABNORMAL HIGH (ref 0.0–1.2)
CO2: 27 mmol/L (ref 20–29)
Calcium: 9.9 mg/dL (ref 8.7–10.2)
Chloride: 102 mmol/L (ref 96–106)
Creatinine, Ser: 0.81 mg/dL (ref 0.57–1.00)
GFR calc Af Amer: 96 mL/min/{1.73_m2} (ref >59)
GFR calc non Af Amer: 83 mL/min/{1.73_m2} (ref >59)
Globulin, Total: 2.6 g/dL (ref 1.5–4.5)
Glucose: 85 mg/dL (ref 65–99)
Potassium: 5.2 mmol/L (ref 3.5–5.2)
Sodium: 142 mmol/L (ref 134–144)
Total Protein: 7.1 g/dL (ref 6.0–8.5)

## 2018-12-03 LAB — CBC WITH DIFFERENTIAL/PLATELET
Basophils Absolute: 0 10*3/uL (ref 0.0–0.2)
Basos: 0 %
EOS (ABSOLUTE): 0.1 10*3/uL (ref 0.0–0.4)
Eos: 2 %
Hematocrit: 44.9 % (ref 34.0–46.6)
Hemoglobin: 15.4 g/dL (ref 11.1–15.9)
Immature Grans (Abs): 0 10*3/uL (ref 0.0–0.1)
Immature Granulocytes: 0 %
Lymphocytes Absolute: 2.6 10*3/uL (ref 0.7–3.1)
Lymphs: 33 %
MCH: 31.4 pg (ref 26.6–33.0)
MCHC: 34.3 g/dL (ref 31.5–35.7)
MCV: 91 fL (ref 79–97)
Monocytes Absolute: 0.5 10*3/uL (ref 0.1–0.9)
Monocytes: 6 %
Neutrophils Absolute: 4.6 10*3/uL (ref 1.4–7.0)
Neutrophils: 59 %
Platelets: 243 10*3/uL (ref 150–450)
RBC: 4.91 x10E6/uL (ref 3.77–5.28)
RDW: 12.4 % (ref 11.7–15.4)
WBC: 7.8 10*3/uL (ref 3.4–10.8)

## 2018-12-03 LAB — LIPID PANEL
Chol/HDL Ratio: 3.2 ratio (ref 0.0–4.4)
Cholesterol, Total: 152 mg/dL (ref 100–199)
HDL: 47 mg/dL (ref >39)
LDL Chol Calc (NIH): 62 mg/dL (ref 0–99)
Triglycerides: 274 mg/dL — ABNORMAL HIGH (ref 0–149)
VLDL Cholesterol Cal: 43 mg/dL — ABNORMAL HIGH (ref 5–40)

## 2018-12-11 ENCOUNTER — Other Ambulatory Visit: Payer: Self-pay | Admitting: Family Medicine

## 2018-12-11 DIAGNOSIS — E785 Hyperlipidemia, unspecified: Secondary | ICD-10-CM

## 2018-12-11 MED ORDER — ROSUVASTATIN CALCIUM 5 MG PO TABS: 5.0000 mg | ORAL_TABLET | Freq: Every evening | ORAL | 2 refills | Status: DC

## 2019-01-01 ENCOUNTER — Other Ambulatory Visit: Payer: Self-pay | Admitting: Family Medicine

## 2019-01-01 ENCOUNTER — Telehealth: Payer: Self-pay | Admitting: Family Medicine

## 2019-01-01 NOTE — Telephone Encounter (Signed)
She gets the stacked dose of the Imitrex RF. They are just going to fax [place] over the refill and I will get you to sign.

## 2019-01-28 ENCOUNTER — Encounter: Payer: Self-pay | Admitting: Family Medicine

## 2019-01-28 ENCOUNTER — Telehealth: Payer: Self-pay

## 2019-01-28 NOTE — Telephone Encounter (Signed)
I called and gave telephone order that they can substitute.

## 2019-01-28 NOTE — Telephone Encounter (Signed)
Patient is taking Sumatriptan for migraines.  She had been prescribed this medication in another form but now wants to get a prescription for Sumatriptan injection autoinjector system 6 mg/0.5 ml.  The version she was getting before is on backorder and she is unable find it anywhere but the pharmacy is stocking the autoinjector system.  Her pharmacy is [facility] on [address] in [place], [place].

## 2019-02-16 ENCOUNTER — Other Ambulatory Visit: Payer: Self-pay | Admitting: Family Medicine

## 2019-05-02 ENCOUNTER — Other Ambulatory Visit: Payer: Self-pay | Admitting: Family Medicine

## 2019-07-31 ENCOUNTER — Other Ambulatory Visit: Payer: Self-pay | Admitting: Family Medicine

## 2019-09-06 ENCOUNTER — Encounter: Payer: Self-pay | Admitting: Family Medicine

## 2019-09-16 ENCOUNTER — Other Ambulatory Visit: Payer: Self-pay | Admitting: Family Medicine

## 2019-09-16 DIAGNOSIS — E785 Hyperlipidemia, unspecified: Secondary | ICD-10-CM

## 2019-10-25 ENCOUNTER — Other Ambulatory Visit: Payer: Self-pay | Admitting: Family Medicine

## 2019-10-25 ENCOUNTER — Encounter: Payer: Self-pay | Admitting: Family Medicine

## 2019-10-25 MED ORDER — SUMATRIPTAN SUCCINATE 6 MG/0.5ML ~~LOC~~ SOAJ: 0.5000 mL | Freq: Every day | SUBCUTANEOUS | 2 refills | Status: DC | PRN

## 2019-11-15 ENCOUNTER — Other Ambulatory Visit: Payer: Self-pay | Admitting: Family Medicine

## 2019-11-15 DIAGNOSIS — E785 Hyperlipidemia, unspecified: Secondary | ICD-10-CM

## 2019-11-19 ENCOUNTER — Other Ambulatory Visit: Payer: Self-pay | Admitting: Nurse Practitioner

## 2019-11-19 DIAGNOSIS — Z803 Family history of malignant neoplasm of breast: Secondary | ICD-10-CM

## 2019-12-02 ENCOUNTER — Other Ambulatory Visit: Payer: Self-pay

## 2019-12-02 ENCOUNTER — Ambulatory Visit (INDEPENDENT_AMBULATORY_CARE_PROVIDER_SITE_OTHER): Payer: BC Managed Care – PPO | Admitting: Family Medicine

## 2019-12-02 ENCOUNTER — Encounter: Payer: Self-pay | Admitting: Family Medicine

## 2019-12-02 VITALS — BP 118/76 | HR 68 | Temp 97.2°F | Ht 68.0 in | Wt 190.2 lb

## 2019-12-02 DIAGNOSIS — E785 Hyperlipidemia, unspecified: Secondary | ICD-10-CM | POA: Diagnosis not present

## 2019-12-02 DIAGNOSIS — Z23 Encounter for immunization: Secondary | ICD-10-CM

## 2019-12-02 DIAGNOSIS — Z0001 Encounter for general adult medical examination with abnormal findings: Secondary | ICD-10-CM | POA: Diagnosis not present

## 2019-12-02 DIAGNOSIS — Z0000 Encounter for general adult medical examination without abnormal findings: Secondary | ICD-10-CM

## 2019-12-02 DIAGNOSIS — J302 Other seasonal allergic rhinitis: Secondary | ICD-10-CM

## 2019-12-02 DIAGNOSIS — E539 Vitamin B deficiency, unspecified: Secondary | ICD-10-CM | POA: Diagnosis not present

## 2019-12-02 DIAGNOSIS — Z780 Asymptomatic menopausal state: Secondary | ICD-10-CM

## 2019-12-02 DIAGNOSIS — E559 Vitamin D deficiency, unspecified: Secondary | ICD-10-CM | POA: Diagnosis not present

## 2019-12-02 DIAGNOSIS — G43119 Migraine with aura, intractable, without status migrainosus: Secondary | ICD-10-CM

## 2019-12-02 MED ORDER — ROSUVASTATIN CALCIUM 5 MG PO TABS: ORAL_TABLET | ORAL | 3 refills | Status: DC

## 2019-12-02 MED ORDER — SUMATRIPTAN SUCCINATE 6 MG/0.5ML ~~LOC~~ SOAJ: 0.5000 mL | Freq: Every day | SUBCUTANEOUS | 2 refills | Status: DC | PRN

## 2019-12-02 NOTE — Progress Notes (Signed)
Assessment & Plan:  1. Well adult exam - Preventive health education provided. Patient declined hepatitis C and HIV screening. - CBC with Differential/Platelet - CMP14+EGFR - Lipid panel  2. Hyperlipidemia, unspecified hyperlipidemia type - Well controlled on current regimen.  - rosuvastatin (CRESTOR) 5 MG tablet; TAKE 1 TABLET(5 MG) BY MOUTH EVERY EVENING  Dispense: 90 tablet; Refill: 3 - CMP14+EGFR  3. Vitamin D deficiency - Well controlled on current regimen.  - VITAMIN D 25 Hydroxy (Vit-D Deficiency, Fractures)  4. Vitamin B deficiency - Well controlled on current regimen.  - Vitamin B12  5. Intractable migraine with aura without status migrainosus - Well controlled on current regimen.  - SUMAtriptan 6 MG/0.5ML SOAJ; Inject 0.5 mLs into the skin daily as needed. May repeat after 2 hours if needed.  Dispense: 4 mL; Refill: 2  6. Seasonal allergies - Well controlled on current regimen.   7. Menopause - Hormone therapy managed by [facility].  8. Need for immunization against influenza - Flu Vaccine QUAD 36+ mos IM   Follow-up: Return in about 1 year (around 12/01/2020) for annual physical.   Hendricks Limes, MSN, APRN, FNP-C [facility]  Subjective:  Patient ID: Elizabeth Harris, female    DOB: December 26, 1965  Age: 54 y.o. MRN: [id number]  Patient Care Team: Loman Brooklyn, FNP as PCP - General (Family Medicine) Vanessa Kick, MD as Consulting Physician (Obstetrics and Gynecology)   CC:  Chief Complaint  Patient presents with  . Annual Exam  . Knee Pain    Bilateral knee pain x 3 weeks - works at [employer]    HPI [place] presents for her annual physical.  Occupation: [employer], Marital status: Married, Substance use: None Last colonoscopy: 01/31/2016 Last mammogram: 10/13/2019 Last pap smear: hysterectomy Hepatitis C Screening: declined Immunizations: Flu Vaccine: getting today Tdap Vaccine: up to date  Shingrix Vaccine: at the  pharmacy for administration, so it can be run through insurance first   COVID-19 Vaccine: up to date  [place] 2/9 Scores 12/02/2019 12/02/2018 02/01/2015  PHQ - 2 Score 0 0 0     Review of Systems  Constitutional: Negative for chills, fever, malaise/fatigue and weight loss.  HENT: Negative for congestion, ear discharge, ear pain, nosebleeds, sinus pain, sore throat and tinnitus.   Eyes: Negative for blurred vision, double vision, pain, discharge and redness.  Respiratory: Negative for cough, shortness of breath and wheezing.   Cardiovascular: Negative for chest pain, palpitations and leg swelling.  Gastrointestinal: Negative for abdominal pain, constipation, diarrhea, heartburn, nausea and vomiting.  Genitourinary: Negative for dysuria, frequency and urgency.  Musculoskeletal: Negative for myalgias.  Skin: Negative for rash.  Neurological: Negative for dizziness, seizures, weakness and headaches.  Psychiatric/Behavioral: Negative for depression, substance abuse and suicidal ideas. The patient is not nervous/anxious.     Current Outpatient Medications:  .  Cyanocobalamin (VITAMIN B-12 PO), Take by mouth., Disp: , Rfl:  .  fluticasone (FLONASE) 50 MCG/ACT nasal spray, Place 2 sprays into both nostrils daily., Disp: 16 g, Rfl: 6 .  levocetirizine (XYZAL) 5 MG tablet, Take 1 tablet (5 mg total) by mouth every evening., Disp: 90 tablet, Rfl: 3 .  Multiple Vitamins-Minerals (MULTI-VITAMIN GUMMIES PO), Take by mouth daily., Disp: , Rfl:  .  progesterone (PROMETRIUM) 100 MG capsule, Take 150 mg by mouth daily., Disp: , Rfl:  .  rosuvastatin (CRESTOR) 5 MG tablet, TAKE 1 TABLET(5 MG) BY MOUTH EVERY EVENING, Disp: 90 tablet, Rfl: 0 .  SUMAtriptan 6 MG/0.5ML  SOAJ, Inject 0.5 mLs into the skin daily as needed. May repeat after 2 hours if needed., Disp: 4 mL, Rfl: 2 .  Testosterone 25 MG PLLT, 125 mg by Implant route., Disp: , Rfl:  .  VITAMIN D, CHOLECALCIFEROL, PO, Take 2,000 mg by  mouth daily., Disp: , Rfl:   No Known Allergies  Past Medical History:  Diagnosis Date  . Allergy   . Arthritis   . Hyperlipidemia   . Migraine   . Vitamin B deficiency   . Vitamin D deficiency     Past Surgical History:  Procedure Laterality Date  . CHOLECYSTECTOMY  11/2016  . PARTIAL HYSTERECTOMY     due to uterine prolapse. removed cervix    Family History  Problem Relation Age of Onset  . Hypertension Mother   . Migraines Mother   . Diabetes Father   . Heart disease Father   . Migraines Sister   . Migraines Maternal Grandmother   . Breast cancer Paternal Grandmother   . Migraines Sister   . Autism Son   . Colon cancer Neg Hx     Social History   Socioeconomic History  . Marital status: Married    Spouse name: Rayanna Matusik  . Number of children: 1  . Years of education: 37  . Highest education level: Bachelor's degree (e.g., BA, AB, BS)  Occupational History  . 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    Employer: [employer]  Tobacco Use  . Smoking status: Never Smoker  . Smokeless tobacco: Never Used  Vaping Use  . Vaping Use: Never used  Substance and Sexual Activity  . Alcohol use: No  . Drug use: No  . Sexual activity: Yes  Other Topics Concern  . 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     Lives with her husband.   Her son Deedra Ehrich lives with them. His children, Gerald Stabs and Mickel Baas, also live with them.   Social Determinants of Health   Financial Resource Strain:   . Difficulty of Paying Living Expenses: Not on file  Food Insecurity:   . Worried About Charity fundraiser in the Last Year: Not on file  . Ran Out of Food in the Last Year: Not on file  Transportation Needs:   . Lack of Transportation (Medical): Not on file  . Lack of Transportation (Non-Medical): Not on file  Physical Activity:   . Days of Exercise per Week: Not on file  . Minutes of  Exercise per Session: Not on file  Stress:   . Feeling of Stress : Not on file  Social Connections:   . Frequency of Communication with Friends and Family: Not on file  . Frequency of Social Gatherings with Friends and Family: Not on file  . Attends Religious Services: Not on file  . Active Member of Clubs or Organizations: Not on file  . Attends Archivist Meetings: Not on file  . Marital Status: Not on file  Intimate Partner Violence:   . Fear of Current or Ex-Partner: Not on file  . Emotionally Abused: Not on file  . Physically Abused: Not on file  . Sexually Abused: Not on file      Objective:    BP 118/76   Pulse 68   Temp (!) 97.2 F (36.2 C) (Temporal)   Ht $R'5\' 8"'Hn$  (1.727 m)   Wt 190 lb 3.2 oz (  86.3 kg)   SpO2 97%   BMI 28.92 kg/m   Wt Readings from Last 3 Encounters:  12/02/19 190 lb 3.2 oz (86.3 kg)  12/02/18 184 lb 3.2 oz (83.6 kg)  01/31/16 184 lb (83.5 kg)    Physical Exam Vitals reviewed.  Constitutional:      General: She is not in acute distress.    Appearance: Normal appearance. She is overweight. She is not ill-appearing, toxic-appearing or diaphoretic.  HENT:     Head: Normocephalic and atraumatic.     Right Ear: Tympanic membrane, ear canal and external ear normal. There is no impacted cerumen.     Left Ear: Tympanic membrane, ear canal and external ear normal. There is no impacted cerumen.     Nose: Nose normal. No congestion or rhinorrhea.     Mouth/Throat:     Mouth: Mucous membranes are moist.     Pharynx: Oropharynx is clear. No oropharyngeal exudate or posterior oropharyngeal erythema.  Eyes:     General: No scleral icterus.       Right eye: No discharge.        Left eye: No discharge.     Conjunctiva/sclera: Conjunctivae normal.     Pupils: Pupils are equal, round, and reactive to light.  Cardiovascular:     Rate and Rhythm: Normal rate and regular rhythm.     Heart sounds: Normal heart sounds. No murmur heard.  No friction  rub. No gallop.   Pulmonary:     Effort: Pulmonary effort is normal. No respiratory distress.     Breath sounds: Normal breath sounds. No stridor. No wheezing, rhonchi or rales.  Abdominal:     General: Abdomen is flat. Bowel sounds are normal. There is no distension.     Palpations: Abdomen is soft. There is no hepatomegaly, splenomegaly or mass.     Tenderness: There is no abdominal tenderness. There is no guarding or rebound.     Hernia: No hernia is present.  Musculoskeletal:        General: Normal range of motion.     Cervical back: Normal range of motion and neck supple. No rigidity. No muscular tenderness.  Lymphadenopathy:     Cervical: No cervical adenopathy.  Skin:    General: Skin is warm and dry.     Capillary Refill: Capillary refill takes less than 2 seconds.  Neurological:     General: No focal deficit present.     Mental Status: She is alert and oriented to person, place, and time. Mental status is at baseline.  Psychiatric:        Mood and Affect: Mood normal.        Behavior: Behavior normal.        Thought Content: Thought content normal.        Judgment: Judgment normal.     No results found for: TSH Lab Results  Component Value Date   WBC 7.8 12/02/2018   HGB 15.4 12/02/2018   HCT 44.9 12/02/2018   MCV 91 12/02/2018   PLT 243 12/02/2018   Lab Results  Component Value Date   NA 142 12/02/2018   K 5.2 12/02/2018   CO2 27 12/02/2018   GLUCOSE 85 12/02/2018   BUN 11 12/02/2018   CREATININE 0.81 12/02/2018   BILITOT 1.5 (H) 12/02/2018   ALKPHOS 73 12/02/2018   AST 17 12/02/2018   ALT 12 12/02/2018   PROT 7.1 12/02/2018   ALBUMIN 4.5 12/02/2018   CALCIUM 9.9 12/02/2018   Lab  Results  Component Value Date   CHOL 152 12/02/2018   Lab Results  Component Value Date   HDL 47 12/02/2018   Lab Results  Component Value Date   LDLCALC 62 12/02/2018   Lab Results  Component Value Date   TRIG 274 (H) 12/02/2018   Lab Results  Component Value  Date   CHOLHDL 3.2 12/02/2018   No results found for: HGBA1C

## 2019-12-02 NOTE — Patient Instructions (Signed)

## 2019-12-03 LAB — CMP14+EGFR
ALT: 15 IU/L (ref 0–32)
AST: 16 IU/L (ref 0–40)
Albumin/Globulin Ratio: 1.7 (ref 1.2–2.2)
Albumin: 4.5 g/dL (ref 3.8–4.9)
Alkaline Phosphatase: 68 IU/L (ref 44–121)
BUN/Creatinine Ratio: 12 (ref 9–23)
BUN: 11 mg/dL (ref 6–24)
Bilirubin Total: 1.8 mg/dL — ABNORMAL HIGH (ref 0.0–1.2)
CO2: 28 mmol/L (ref 20–29)
Calcium: 9.8 mg/dL (ref 8.7–10.2)
Chloride: 101 mmol/L (ref 96–106)
Creatinine, Ser: 0.93 mg/dL (ref 0.57–1.00)
GFR calc Af Amer: 81 mL/min/{1.73_m2} (ref >59)
GFR calc non Af Amer: 70 mL/min/{1.73_m2} (ref >59)
Globulin, Total: 2.6 g/dL (ref 1.5–4.5)
Glucose: 102 mg/dL — ABNORMAL HIGH (ref 65–99)
Potassium: 4.7 mmol/L (ref 3.5–5.2)
Sodium: 140 mmol/L (ref 134–144)
Total Protein: 7.1 g/dL (ref 6.0–8.5)

## 2019-12-03 LAB — CBC WITH DIFFERENTIAL/PLATELET
Basophils Absolute: 0 10*3/uL (ref 0.0–0.2)
Basos: 0 %
EOS (ABSOLUTE): 0.1 10*3/uL (ref 0.0–0.4)
Eos: 1 %
Hematocrit: 48.1 % — ABNORMAL HIGH (ref 34.0–46.6)
Hemoglobin: 16.1 g/dL — ABNORMAL HIGH (ref 11.1–15.9)
Immature Grans (Abs): 0 10*3/uL (ref 0.0–0.1)
Immature Granulocytes: 0 %
Lymphocytes Absolute: 2.5 10*3/uL (ref 0.7–3.1)
Lymphs: 32 %
MCH: 31.7 pg (ref 26.6–33.0)
MCHC: 33.5 g/dL (ref 31.5–35.7)
MCV: 95 fL (ref 79–97)
Monocytes Absolute: 0.4 10*3/uL (ref 0.1–0.9)
Monocytes: 5 %
Neutrophils Absolute: 4.8 10*3/uL (ref 1.4–7.0)
Neutrophils: 62 %
Platelets: 272 10*3/uL (ref 150–450)
RBC: 5.08 x10E6/uL (ref 3.77–5.28)
RDW: 12 % (ref 11.7–15.4)
WBC: 7.9 10*3/uL (ref 3.4–10.8)

## 2019-12-03 LAB — LIPID PANEL
Chol/HDL Ratio: 3.4 ratio (ref 0.0–4.4)
Cholesterol, Total: 160 mg/dL (ref 100–199)
HDL: 47 mg/dL (ref >39)
LDL Chol Calc (NIH): 78 mg/dL (ref 0–99)
Triglycerides: 210 mg/dL — ABNORMAL HIGH (ref 0–149)
VLDL Cholesterol Cal: 35 mg/dL (ref 5–40)

## 2019-12-03 LAB — VITAMIN D 25 HYDROXY (VIT D DEFICIENCY, FRACTURES): Vit D, 25-Hydroxy: 33.3 ng/mL (ref 30.0–100.0)

## 2019-12-03 LAB — VITAMIN B12: Vitamin B-12: 673 pg/mL (ref 232–1245)

## 2019-12-04 ENCOUNTER — Encounter: Payer: Self-pay | Admitting: Family Medicine

## 2019-12-10 ENCOUNTER — Other Ambulatory Visit: Payer: BLUE CROSS/BLUE SHIELD

## 2019-12-13 ENCOUNTER — Other Ambulatory Visit: Payer: Self-pay

## 2019-12-13 ENCOUNTER — Ambulatory Visit
Admission: RE | Admit: 2019-12-13 | Discharge: 2019-12-13 | Disposition: A | Discharge status: Home / Self Care | Payer: BC Managed Care – PPO | Source: Ambulatory Visit | Attending: Nurse Practitioner | Admitting: Nurse Practitioner

## 2019-12-13 DIAGNOSIS — Z803 Family history of malignant neoplasm of breast: Secondary | ICD-10-CM

## 2019-12-13 MED ORDER — GADOBUTROL 1 MMOL/ML IV SOLN
8.0000 mL | Freq: Once | INTRAVENOUS | Status: AC | PRN
  Administered 2019-12-13: 8 mL via INTRAVENOUS

## 2020-02-01 ENCOUNTER — Encounter: Payer: Self-pay | Admitting: Family Medicine

## 2020-03-15 ENCOUNTER — Encounter: Payer: Self-pay | Admitting: Family Medicine

## 2020-03-15 DIAGNOSIS — E785 Hyperlipidemia, unspecified: Secondary | ICD-10-CM

## 2020-03-15 MED ORDER — ROSUVASTATIN CALCIUM 5 MG PO TABS: ORAL_TABLET | ORAL | 3 refills | Status: DC

## 2020-04-18 ENCOUNTER — Other Ambulatory Visit: Payer: Self-pay | Admitting: Family Medicine

## 2020-04-18 DIAGNOSIS — G43119 Migraine with aura, intractable, without status migrainosus: Secondary | ICD-10-CM

## 2020-05-03 DIAGNOSIS — M9901 Segmental and somatic dysfunction of cervical region: Secondary | ICD-10-CM | POA: Diagnosis not present

## 2020-05-03 DIAGNOSIS — M9902 Segmental and somatic dysfunction of thoracic region: Secondary | ICD-10-CM | POA: Diagnosis not present

## 2020-05-03 DIAGNOSIS — M62830 Muscle spasm of back: Secondary | ICD-10-CM | POA: Diagnosis not present

## 2020-05-03 DIAGNOSIS — M9903 Segmental and somatic dysfunction of lumbar region: Secondary | ICD-10-CM | POA: Diagnosis not present

## 2020-05-12 ENCOUNTER — Encounter: Payer: Self-pay | Admitting: Family Medicine

## 2020-05-17 ENCOUNTER — Other Ambulatory Visit: Payer: Self-pay

## 2020-05-17 ENCOUNTER — Ambulatory Visit: Payer: BC Managed Care – PPO | Admitting: Family Medicine

## 2020-05-17 ENCOUNTER — Encounter: Payer: Self-pay | Admitting: Family Medicine

## 2020-05-17 ENCOUNTER — Ambulatory Visit (INDEPENDENT_AMBULATORY_CARE_PROVIDER_SITE_OTHER): Payer: BC Managed Care – PPO

## 2020-05-17 VITALS — BP 137/75 | HR 71 | Temp 97.9°F | Ht 68.0 in | Wt 188.1 lb

## 2020-05-17 DIAGNOSIS — M542 Cervicalgia: Secondary | ICD-10-CM

## 2020-05-17 DIAGNOSIS — M5412 Radiculopathy, cervical region: Secondary | ICD-10-CM | POA: Diagnosis not present

## 2020-05-17 MED ORDER — PREDNISONE 10 MG (21) PO TBPK: ORAL_TABLET | ORAL | 0 refills | Status: DC

## 2020-05-17 MED ORDER — METHYLPREDNISOLONE ACETATE 40 MG/ML IJ SUSP
40.0000 mg | Freq: Once | INTRAMUSCULAR | Status: AC
  Administered 2020-05-17: 80 mg via INTRAMUSCULAR

## 2020-05-17 MED ORDER — TIZANIDINE HCL 4 MG PO CAPS: 4.0000 mg | ORAL_CAPSULE | Freq: Three times a day (TID) | ORAL | 0 refills | Status: DC | PRN

## 2020-05-17 NOTE — Progress Notes (Signed)
Acute Office Visit  Subjective:    Patient ID: [place], female    DOB: 03/27/1965, 55 y.o.   MRN: [id number]  Chief Complaint  Patient presents with  . Neck Pain    HPI Patient is in today for neck pain x 3 weeks. It started as a achy pain in her neck. The pain is moderate. She was seen by her chiropractor 2 weeks ago and asked him to "pop" her neck. She felt immediate relief after. However the pain returned shortly after and she also started having tingling from her neck down her right shoulder and to her right hand. The tingling was previously intermittent and is now constant. She denies recent injury. Denies decreased ROM or decreased strength. Denies fever, erythema, or swelling. Denies headache or changes in vision. She has tried biofreeze and ice with some improvement. She works in Engineer, technical sales.   Past Medical History:  Diagnosis Date  . Allergy   . Arthritis   . Hyperlipidemia   . Migraine   . Vitamin B deficiency   . Vitamin D deficiency     Past Surgical History:  Procedure Laterality Date  . CHOLECYSTECTOMY  11/2016  . PARTIAL HYSTERECTOMY     due to uterine prolapse. removed cervix    Family History  Problem Relation Age of Onset  . Hypertension Mother   . Migraines Mother   . Diabetes Father   . Heart disease Father   . Migraines Sister   . Migraines Maternal Grandmother   . Breast cancer Paternal Grandmother   . Migraines Sister   . Autism Son   . Colon cancer Neg Hx     Social History   Socioeconomic History  . Marital status: Married    Spouse name: Dominick Zertuche  . Number of children: 1  . Years of education: 14  . Highest education level: Bachelor's degree (e.g., BA, AB, BS)  Occupational History  . 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    Employer: [employer]  Tobacco Use  . Smoking status: Never Smoker  . Smokeless tobacco: Never Used  Vaping Use  . Vaping Use: Never used  Substance and Sexual Activity  . Alcohol use: No   . Drug use: No  . Sexual activity: Yes  Other Topics Concern  . 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     Lives with her husband.   Her son Deedra Ehrich lives with them. His children, Gerald Stabs and Mickel Baas, also live with them.   Social Determinants of Health   Financial Resource Strain: Not on file  Food Insecurity: Not on file  Transportation Needs: Not on file  Physical Activity: Not on file  Stress: Not on file  Social Connections: Not on file  Intimate Partner Violence: Not on file    Outpatient Medications Prior to Visit  Medication Sig Dispense Refill  . Cyanocobalamin (VITAMIN B-12 PO) Take by mouth.    . fluticasone (FLONASE) 50 MCG/ACT nasal spray Place 2 sprays into both nostrils daily. 16 g 6  . levocetirizine (XYZAL) 5 MG tablet Take 1 tablet (5 mg total) by mouth every evening. 90 tablet 3  . Multiple Vitamins-Minerals (MULTI-VITAMIN GUMMIES PO) Take by mouth daily.    . progesterone (PROMETRIUM) 100 MG capsule Take 150 mg by mouth daily.    . rosuvastatin (CRESTOR) 5 MG tablet TAKE 1 TABLET(5 MG) BY MOUTH EVERY EVENING 90 tablet  3  . SUMAtriptan 6 MG/0.5ML SOAJ ADMINISTER 0.5 ML UNDER THE SKIN DAILY AS NEEDED. MAY REPEAT AFTER 2 HOURS AS NEEDED 4 mL 2  . Testosterone 25 MG PLLT 125 mg by Implant route.    Marland Kitchen VITAMIN D, CHOLECALCIFEROL, PO Take 2,000 mg by mouth daily.     No facility-administered medications prior to visit.    No Known Allergies  Review of Systems As per HPI.     Objective:    Physical Exam Vitals and nursing note reviewed.  Constitutional:      General: She is not in acute distress.    Appearance: Normal appearance. She is not ill-appearing, toxic-appearing or diaphoretic.  HENT:     Head: Normocephalic and atraumatic.  Eyes:     Extraocular Movements: Extraocular movements intact.     Pupils: Pupils are equal, round, and reactive to light.  Pulmonary:      Effort: Pulmonary effort is normal. No respiratory distress.  Musculoskeletal:     Right shoulder: No swelling, deformity, effusion, tenderness or bony tenderness. Normal range of motion. Normal strength.     Right upper arm: No swelling, edema, lacerations, tenderness or bony tenderness.     Right hand: No swelling, deformity, tenderness or bony tenderness. Normal range of motion. Normal strength. Normal sensation.     Cervical back: Tenderness (tenderness to right side of neck) present. No swelling, edema, deformity, erythema, rigidity or bony tenderness. No pain with movement. Normal range of motion.  Skin:    General: Skin is warm and dry.     Findings: No erythema.  Neurological:     General: No focal deficit present.     Mental Status: She is alert and oriented to person, place, and time.     Motor: No weakness.     Gait: Gait normal.  Psychiatric:        Mood and Affect: Mood normal.        Behavior: Behavior normal.        Thought Content: Thought content normal.        Judgment: Judgment normal.     BP 137/75   Pulse 71   Temp 97.9 F (36.6 C) (Temporal)   Ht 5\' 8"  (1.727 m)   Wt 188 lb 2 oz (85.3 kg)   BMI 28.60 kg/m  Wt Readings from Last 3 Encounters:  05/17/20 188 lb 2 oz (85.3 kg)  12/02/19 190 lb 3.2 oz (86.3 kg)  12/02/18 184 lb 3.2 oz (83.6 kg)    Health Maintenance Due  Topic Date Due  . HIV Screening  Never done  . COVID-19 Vaccine (3 - Booster for Pfizer series) 12/16/2019    There are no preventive care reminders to display for this patient.   No results found for: TSH Lab Results  Component Value Date   WBC 7.9 12/02/2019   HGB 16.1 (H) 12/02/2019   HCT 48.1 (H) 12/02/2019   MCV 95 12/02/2019   PLT 272 12/02/2019   Lab Results  Component Value Date   NA 140 12/02/2019   K 4.7 12/02/2019   CO2 28 12/02/2019   GLUCOSE 102 (H) 12/02/2019   BUN 11 12/02/2019   CREATININE 0.93 12/02/2019   BILITOT 1.8 (H) 12/02/2019   ALKPHOS 68  12/02/2019   AST 16 12/02/2019   ALT 15 12/02/2019   PROT 7.1 12/02/2019   ALBUMIN 4.5 12/02/2019   CALCIUM 9.8 12/02/2019   Lab Results  Component Value Date   CHOL 160 12/02/2019  Lab Results  Component Value Date   HDL 47 12/02/2019   Lab Results  Component Value Date   LDLCALC 78 12/02/2019   Lab Results  Component Value Date   TRIG 210 (H) 12/02/2019   Lab Results  Component Value Date   CHOLHDL 3.4 12/02/2019   No results found for: HGBA1C     Assessment & Plan:   Ameera was seen today for neck pain.  Diagnoses and all orders for this visit:  Neck pain Xray today if office, radiology report pending. Discusses likley cervical radiculopathy. Steroid IM injection today in office. Prednisone pack ordered. Zanaflex for spasms. Tylenol, advil, ice, ice. Referral to PT. Handout given.  -     DG Cervical Spine Complete; Future -     methylPREDNISolone acetate (DEPO-MEDROL) injection 40 mg -     predniSONE (STERAPRED UNI-PAK 21 TAB) 10 MG (21) TBPK tablet; Use as directed on back of pill pack -     Ambulatory referral to Physical Therapy -     tiZANidine (ZANAFLEX) 4 MG capsule; Take 1 capsule (4 mg total) by mouth 3 (three) times daily as needed for muscle spasms.  Cervical radiculopathy Medications as below. Referral to PT. Handout given.  -     methylPREDNISolone acetate (DEPO-MEDROL) injection 40 mg -     predniSONE (STERAPRED UNI-PAK 21 TAB) 10 MG (21) TBPK tablet; Use as directed on back of pill pack -     Ambulatory referral to Physical Therapy -     tiZANidine (ZANAFLEX) 4 MG capsule; Take 1 capsule (4 mg total) by mouth 3 (three) times daily as needed for muscle spasms.   Return to office for new or worsening symptoms, or if symptoms persist.   The patient indicates understanding of these issues and agrees with the plan.   Gwenlyn Perking, FNP

## 2020-05-17 NOTE — Patient Instructions (Signed)
Cervical Radiculopathy  Cervical radiculopathy happens when a nerve in the neck (a cervical nerve) is pinched or bruised. This condition can happen because of an injury to the cervical spine (vertebrae) in the neck, or as part of the normal aging process. Pressure on the cervical nerves can cause pain or numbness that travels from the neck all the way down into the arm and fingers. Usually, this condition gets better with rest. Treatment may be needed if the condition does not improve. What are the causes? This condition may be caused by:  A neck injury.  A bulging (herniated) disk.  Muscle spasms.  Muscle tightness in the neck because of overuse.  Arthritis.  Breakdown or degeneration in the bones and joints of the spine (spondylosis) due to aging.  Bone spurs that may develop near the cervical nerves. What are the signs or symptoms? Symptoms of this condition include:  Pain. The pain may travel from the neck to the arm and hand. The pain can be severe or irritating. It may be worse when you move your neck.  Numbness or tingling in your arm or hand.  Weakness in the affected arm and hand, in severe cases. How is this diagnosed? This condition may be diagnosed based on your symptoms, your medical history, and a physical exam. You may also have tests, including:  X-rays.  A CT scan.  An MRI.  An electromyogram (EMG).  Nerve conduction tests. How is this treated? In many cases, treatment is not needed for this condition. With rest, the condition usually gets better over time. If treatment is needed, options may include:  Wearing a soft neck collar (cervical collar) for short periods of time, as told by your health care provider.  Doing physical therapy to strengthen your neck muscles.  Taking medicines, such as NSAIDs or oral corticosteroids.  Having spinal injections, in severe cases.  Having surgery. This may be needed if other treatments do not help. Different types  of surgery may be done depending on the cause of this condition. Follow these instructions at home: If you have a cervical collar:  Wear it as told by your health care provider. Remove it only as told by your health care provider.  Ask your health care provider if you can remove the collar for cleaning and bathing. If you are allowed to remove the collar for cleaning or bathing: ? Follow instructions from your health care provider about how to remove the collar safely. ? Clean the collar by wiping it with mild soap and water and drying it completely. ? Take out any removable pads in the collar every 1-2 days, and wash them by hand with soap and water. Let them air-dry completely before you put them back in the collar. ? Check your skin under the collar for irritation or sores. If you see any, tell your health care provider. Managing pain  Take over-the-counter and prescription medicines only as told by your health care provider.  If directed, put ice on the affected area. ? If you have a soft neck collar, remove it as told by your health care provider. ? Put ice in a plastic bag. ? Place a towel between your skin and the bag. ? Leave the ice on for 20 minutes, 2-3 times a day.  If applying ice does not help, you can try using heat. Use the heat source that your health care provider recommends, such as a moist heat pack or a heating pad. ? Place a towel   between your skin and the heat source. ? Leave the heat on for 20-30 minutes. ? Remove the heat if your skin turns bright red. This is especially important if you are unable to feel pain, heat, or cold. You may have a greater risk of getting burned.  Try a gentle neck and shoulder massage to help relieve symptoms.      Activity  Rest as needed.  Return to your normal activities as told by your health care provider. Ask your health care provider what activities are safe for you.  Do stretching and strengthening exercises as told by  your health care provider or physical therapist.  Do not lift anything that is heavier than 10 lb (4.5 kg) until your health care provider tells you that it is safe. General instructions  Use a flat pillow when you sleep.  Do not drive while wearing a cervical collar. If you do not have a cervical collar, ask your health care provider if it is safe to drive while your neck heals.  Ask your health care provider if the medicine prescribed to you requires you to avoid driving or using heavy machinery.  Do not use any products that contain nicotine or tobacco, such as cigarettes, e-cigarettes, and chewing tobacco. These can delay healing. If you need help quitting, ask your health care provider.  Keep all follow-up visits as told by your health care provider. This is important. Contact a health care provider if:  Your condition does not improve with treatment. Get help right away if:  Your pain gets much worse and cannot be controlled with medicines.  You have weakness or numbness in your hand, arm, face, or leg.  You have a high fever.  You have a stiff, rigid neck.  You lose control of your bowels or your bladder (have incontinence).  You have trouble with walking, balance, or speaking. Summary  Cervical radiculopathy happens when a nerve in the neck is pinched or bruised.  A nerve can get pinched from a bulging disk, arthritis, muscle spasms, or an injury to the neck.  Symptoms include pain, tingling, or numbness radiating from the neck into the arm or hand. Weakness can also occur in severe cases.  Treatment may include rest, wearing a cervical collar, and physical therapy. Medicines may be prescribed to help with pain. In severe cases, injections or surgery may be needed. This information is not intended to replace advice given to you by your health care provider. Make sure you discuss any questions you have with your health care provider. Document Revised: 01/02/2018  Document Reviewed: 01/02/2018 Elsevier Patient Education  2021 Elsevier Inc.  

## 2020-05-18 ENCOUNTER — Encounter: Payer: Self-pay | Admitting: Family Medicine

## 2020-05-18 ENCOUNTER — Other Ambulatory Visit: Payer: Self-pay | Admitting: Family Medicine

## 2020-05-18 DIAGNOSIS — M5412 Radiculopathy, cervical region: Secondary | ICD-10-CM

## 2020-05-19 ENCOUNTER — Other Ambulatory Visit: Payer: Self-pay | Admitting: Family Medicine

## 2020-05-19 DIAGNOSIS — M5412 Radiculopathy, cervical region: Secondary | ICD-10-CM

## 2020-05-20 ENCOUNTER — Encounter: Payer: Self-pay | Admitting: Physical Therapy

## 2020-05-22 ENCOUNTER — Other Ambulatory Visit: Payer: Self-pay | Admitting: Family Medicine

## 2020-05-22 DIAGNOSIS — M5412 Radiculopathy, cervical region: Secondary | ICD-10-CM

## 2020-05-24 ENCOUNTER — Other Ambulatory Visit: Payer: Self-pay

## 2020-05-24 ENCOUNTER — Ambulatory Visit: Payer: BC Managed Care – PPO | Admitting: Physical Therapy

## 2020-05-24 DIAGNOSIS — M9902 Segmental and somatic dysfunction of thoracic region: Secondary | ICD-10-CM | POA: Diagnosis not present

## 2020-05-24 DIAGNOSIS — M62830 Muscle spasm of back: Secondary | ICD-10-CM | POA: Diagnosis not present

## 2020-05-24 DIAGNOSIS — M9901 Segmental and somatic dysfunction of cervical region: Secondary | ICD-10-CM | POA: Diagnosis not present

## 2020-05-24 DIAGNOSIS — M9903 Segmental and somatic dysfunction of lumbar region: Secondary | ICD-10-CM | POA: Diagnosis not present

## 2020-05-24 NOTE — Therapy (Signed)
[facility] [place], [place], [address] Phone: [phone]   Fax:  [phone]  Patient Details  Name: [place] Harris MRN: [id number] Date of Birth: 1965/03/03 Referring Provider:  Gwenlyn Perking, FNP  Encounter Date: 05/24/2020   Cu[place]utemoc Attar, [place] 05/24/2020, 12:01 PM  [facility] [address] [place], [place], [address] Phone: [phone]   Fax:  [phone]   **The patient is currently under the care of a Chiropractor and she has yet to have an Ortho consult.  Following her Ortho consult we will proceed with PT if ordered**    [place] Raymie Trani MPT

## 2020-05-26 DIAGNOSIS — E038 Other specified hypothyroidism: Secondary | ICD-10-CM | POA: Diagnosis not present

## 2020-05-26 DIAGNOSIS — N951 Menopausal and female climacteric states: Secondary | ICD-10-CM | POA: Diagnosis not present

## 2020-05-29 DIAGNOSIS — M9901 Segmental and somatic dysfunction of cervical region: Secondary | ICD-10-CM | POA: Diagnosis not present

## 2020-05-29 DIAGNOSIS — M9902 Segmental and somatic dysfunction of thoracic region: Secondary | ICD-10-CM | POA: Diagnosis not present

## 2020-05-29 DIAGNOSIS — M9903 Segmental and somatic dysfunction of lumbar region: Secondary | ICD-10-CM | POA: Diagnosis not present

## 2020-05-29 DIAGNOSIS — R5383 Other fatigue: Secondary | ICD-10-CM | POA: Diagnosis not present

## 2020-05-29 DIAGNOSIS — M62830 Muscle spasm of back: Secondary | ICD-10-CM | POA: Diagnosis not present

## 2020-05-29 DIAGNOSIS — N951 Menopausal and female climacteric states: Secondary | ICD-10-CM | POA: Diagnosis not present

## 2020-05-29 DIAGNOSIS — R6882 Decreased libido: Secondary | ICD-10-CM | POA: Diagnosis not present

## 2020-05-31 DIAGNOSIS — M9901 Segmental and somatic dysfunction of cervical region: Secondary | ICD-10-CM | POA: Diagnosis not present

## 2020-05-31 DIAGNOSIS — M62830 Muscle spasm of back: Secondary | ICD-10-CM | POA: Diagnosis not present

## 2020-05-31 DIAGNOSIS — M9902 Segmental and somatic dysfunction of thoracic region: Secondary | ICD-10-CM | POA: Diagnosis not present

## 2020-05-31 DIAGNOSIS — M9903 Segmental and somatic dysfunction of lumbar region: Secondary | ICD-10-CM | POA: Diagnosis not present

## 2020-06-05 DIAGNOSIS — M5412 Radiculopathy, cervical region: Secondary | ICD-10-CM | POA: Diagnosis not present

## 2020-06-07 ENCOUNTER — Other Ambulatory Visit: Payer: Self-pay | Admitting: Orthopedic Surgery

## 2020-06-07 DIAGNOSIS — M9902 Segmental and somatic dysfunction of thoracic region: Secondary | ICD-10-CM | POA: Diagnosis not present

## 2020-06-07 DIAGNOSIS — M62830 Muscle spasm of back: Secondary | ICD-10-CM | POA: Diagnosis not present

## 2020-06-07 DIAGNOSIS — M5416 Radiculopathy, lumbar region: Secondary | ICD-10-CM

## 2020-06-07 DIAGNOSIS — M9903 Segmental and somatic dysfunction of lumbar region: Secondary | ICD-10-CM | POA: Diagnosis not present

## 2020-06-07 DIAGNOSIS — M9901 Segmental and somatic dysfunction of cervical region: Secondary | ICD-10-CM | POA: Diagnosis not present

## 2020-06-18 ENCOUNTER — Other Ambulatory Visit: Payer: Self-pay

## 2020-06-18 ENCOUNTER — Ambulatory Visit
Admission: RE | Admit: 2020-06-18 | Discharge: 2020-06-18 | Disposition: A | Discharge status: Home / Self Care | Payer: BC Managed Care – PPO | Source: Ambulatory Visit | Attending: Orthopedic Surgery | Admitting: Orthopedic Surgery

## 2020-06-18 DIAGNOSIS — M5416 Radiculopathy, lumbar region: Secondary | ICD-10-CM

## 2020-06-18 DIAGNOSIS — M4802 Spinal stenosis, cervical region: Secondary | ICD-10-CM | POA: Diagnosis not present

## 2020-06-19 DIAGNOSIS — M9902 Segmental and somatic dysfunction of thoracic region: Secondary | ICD-10-CM | POA: Diagnosis not present

## 2020-06-19 DIAGNOSIS — M62830 Muscle spasm of back: Secondary | ICD-10-CM | POA: Diagnosis not present

## 2020-06-19 DIAGNOSIS — M9901 Segmental and somatic dysfunction of cervical region: Secondary | ICD-10-CM | POA: Diagnosis not present

## 2020-06-19 DIAGNOSIS — M9903 Segmental and somatic dysfunction of lumbar region: Secondary | ICD-10-CM | POA: Diagnosis not present

## 2020-06-23 DIAGNOSIS — M5412 Radiculopathy, cervical region: Secondary | ICD-10-CM | POA: Diagnosis not present

## 2020-07-10 DIAGNOSIS — E038 Other specified hypothyroidism: Secondary | ICD-10-CM | POA: Diagnosis not present

## 2020-07-12 DIAGNOSIS — M9901 Segmental and somatic dysfunction of cervical region: Secondary | ICD-10-CM | POA: Diagnosis not present

## 2020-07-12 DIAGNOSIS — M62830 Muscle spasm of back: Secondary | ICD-10-CM | POA: Diagnosis not present

## 2020-07-12 DIAGNOSIS — M9903 Segmental and somatic dysfunction of lumbar region: Secondary | ICD-10-CM | POA: Diagnosis not present

## 2020-07-12 DIAGNOSIS — M9902 Segmental and somatic dysfunction of thoracic region: Secondary | ICD-10-CM | POA: Diagnosis not present

## 2020-07-19 DIAGNOSIS — J3089 Other allergic rhinitis: Secondary | ICD-10-CM | POA: Diagnosis not present

## 2020-07-19 DIAGNOSIS — J301 Allergic rhinitis due to pollen: Secondary | ICD-10-CM | POA: Diagnosis not present

## 2020-07-19 DIAGNOSIS — M9901 Segmental and somatic dysfunction of cervical region: Secondary | ICD-10-CM | POA: Diagnosis not present

## 2020-07-19 DIAGNOSIS — J3081 Allergic rhinitis due to animal (cat) (dog) hair and dander: Secondary | ICD-10-CM | POA: Diagnosis not present

## 2020-07-19 DIAGNOSIS — M9902 Segmental and somatic dysfunction of thoracic region: Secondary | ICD-10-CM | POA: Diagnosis not present

## 2020-07-19 DIAGNOSIS — M62830 Muscle spasm of back: Secondary | ICD-10-CM | POA: Diagnosis not present

## 2020-07-19 DIAGNOSIS — M9903 Segmental and somatic dysfunction of lumbar region: Secondary | ICD-10-CM | POA: Diagnosis not present

## 2020-07-19 DIAGNOSIS — H1045 Other chronic allergic conjunctivitis: Secondary | ICD-10-CM | POA: Diagnosis not present

## 2020-07-27 DIAGNOSIS — M9902 Segmental and somatic dysfunction of thoracic region: Secondary | ICD-10-CM | POA: Diagnosis not present

## 2020-07-27 DIAGNOSIS — M9903 Segmental and somatic dysfunction of lumbar region: Secondary | ICD-10-CM | POA: Diagnosis not present

## 2020-07-27 DIAGNOSIS — M9901 Segmental and somatic dysfunction of cervical region: Secondary | ICD-10-CM | POA: Diagnosis not present

## 2020-07-27 DIAGNOSIS — M62830 Muscle spasm of back: Secondary | ICD-10-CM | POA: Diagnosis not present

## 2020-08-03 DIAGNOSIS — M9901 Segmental and somatic dysfunction of cervical region: Secondary | ICD-10-CM | POA: Diagnosis not present

## 2020-08-03 DIAGNOSIS — M9903 Segmental and somatic dysfunction of lumbar region: Secondary | ICD-10-CM | POA: Diagnosis not present

## 2020-08-03 DIAGNOSIS — M9902 Segmental and somatic dysfunction of thoracic region: Secondary | ICD-10-CM | POA: Diagnosis not present

## 2020-08-03 DIAGNOSIS — M62830 Muscle spasm of back: Secondary | ICD-10-CM | POA: Diagnosis not present

## 2020-08-08 DIAGNOSIS — M50222 Other cervical disc displacement at C5-C6 level: Secondary | ICD-10-CM | POA: Diagnosis not present

## 2020-08-08 DIAGNOSIS — M5412 Radiculopathy, cervical region: Secondary | ICD-10-CM | POA: Diagnosis not present

## 2020-08-08 DIAGNOSIS — M50122 Cervical disc disorder at C5-C6 level with radiculopathy: Secondary | ICD-10-CM | POA: Diagnosis not present

## 2020-08-08 DIAGNOSIS — M48061 Spinal stenosis, lumbar region without neurogenic claudication: Secondary | ICD-10-CM | POA: Diagnosis not present

## 2020-08-09 DIAGNOSIS — J301 Allergic rhinitis due to pollen: Secondary | ICD-10-CM | POA: Diagnosis not present

## 2020-08-09 DIAGNOSIS — J3081 Allergic rhinitis due to animal (cat) (dog) hair and dander: Secondary | ICD-10-CM | POA: Diagnosis not present

## 2020-08-29 DIAGNOSIS — N951 Menopausal and female climacteric states: Secondary | ICD-10-CM | POA: Diagnosis not present

## 2020-08-29 DIAGNOSIS — E038 Other specified hypothyroidism: Secondary | ICD-10-CM | POA: Diagnosis not present

## 2020-09-04 DIAGNOSIS — G4700 Insomnia, unspecified: Secondary | ICD-10-CM | POA: Diagnosis not present

## 2020-09-04 DIAGNOSIS — N951 Menopausal and female climacteric states: Secondary | ICD-10-CM | POA: Diagnosis not present

## 2020-09-04 DIAGNOSIS — R5383 Other fatigue: Secondary | ICD-10-CM | POA: Diagnosis not present

## 2020-09-09 ENCOUNTER — Other Ambulatory Visit: Payer: Self-pay | Admitting: Family Medicine

## 2020-09-09 DIAGNOSIS — G43119 Migraine with aura, intractable, without status migrainosus: Secondary | ICD-10-CM

## 2020-09-19 DIAGNOSIS — Z9889 Other specified postprocedural states: Secondary | ICD-10-CM | POA: Diagnosis not present

## 2020-10-09 DIAGNOSIS — R509 Fever, unspecified: Secondary | ICD-10-CM | POA: Diagnosis not present

## 2020-10-09 DIAGNOSIS — U071 COVID-19: Secondary | ICD-10-CM | POA: Diagnosis not present

## 2020-10-09 DIAGNOSIS — R051 Acute cough: Secondary | ICD-10-CM | POA: Diagnosis not present

## 2020-10-09 DIAGNOSIS — R197 Diarrhea, unspecified: Secondary | ICD-10-CM | POA: Diagnosis not present

## 2020-10-19 DIAGNOSIS — Z1231 Encounter for screening mammogram for malignant neoplasm of breast: Secondary | ICD-10-CM | POA: Diagnosis not present

## 2020-11-06 DIAGNOSIS — Z9889 Other specified postprocedural states: Secondary | ICD-10-CM | POA: Diagnosis not present

## 2020-11-08 ENCOUNTER — Other Ambulatory Visit: Payer: Self-pay | Admitting: Family Medicine

## 2020-11-08 DIAGNOSIS — G43119 Migraine with aura, intractable, without status migrainosus: Secondary | ICD-10-CM

## 2020-11-16 ENCOUNTER — Encounter: Payer: Self-pay | Admitting: Family Medicine

## 2020-11-16 DIAGNOSIS — Z01419 Encounter for gynecological examination (general) (routine) without abnormal findings: Secondary | ICD-10-CM | POA: Diagnosis not present

## 2020-11-16 DIAGNOSIS — Z803 Family history of malignant neoplasm of breast: Secondary | ICD-10-CM | POA: Diagnosis not present

## 2020-11-16 DIAGNOSIS — Z6829 Body mass index (BMI) 29.0-29.9, adult: Secondary | ICD-10-CM | POA: Diagnosis not present

## 2020-11-16 DIAGNOSIS — Z780 Asymptomatic menopausal state: Secondary | ICD-10-CM | POA: Diagnosis not present

## 2020-12-04 DIAGNOSIS — R5383 Other fatigue: Secondary | ICD-10-CM | POA: Diagnosis not present

## 2020-12-04 DIAGNOSIS — R7309 Other abnormal glucose: Secondary | ICD-10-CM | POA: Diagnosis not present

## 2020-12-04 DIAGNOSIS — E559 Vitamin D deficiency, unspecified: Secondary | ICD-10-CM | POA: Diagnosis not present

## 2020-12-04 DIAGNOSIS — R635 Abnormal weight gain: Secondary | ICD-10-CM | POA: Diagnosis not present

## 2020-12-04 DIAGNOSIS — E038 Other specified hypothyroidism: Secondary | ICD-10-CM | POA: Diagnosis not present

## 2020-12-04 DIAGNOSIS — R7989 Other specified abnormal findings of blood chemistry: Secondary | ICD-10-CM | POA: Diagnosis not present

## 2020-12-04 DIAGNOSIS — N951 Menopausal and female climacteric states: Secondary | ICD-10-CM | POA: Diagnosis not present

## 2020-12-05 ENCOUNTER — Encounter: Payer: BC Managed Care – PPO | Admitting: Family Medicine

## 2020-12-05 LAB — LIPID PANEL
Cholesterol: 186 (ref 0–200)
HDL: 47 (ref 35–70)
LDL Cholesterol: 88
Triglycerides: 255 — AB (ref 40–160)

## 2020-12-05 LAB — IRON,TIBC AND FERRITIN PANEL
Ferritin: 172
Iron: 143

## 2020-12-05 LAB — CBC: RBC: 5.01 (ref 3.87–5.11)

## 2020-12-05 LAB — CBC AND DIFFERENTIAL
HCT: 48 — AB (ref 36–46)
Hemoglobin: 16 (ref 12.0–16.0)
Platelets: 265 (ref 150–399)
WBC: 7

## 2020-12-05 LAB — HEPATIC FUNCTION PANEL
ALT: 17 (ref 7–35)
AST: 18 (ref 13–35)
Alkaline Phosphatase: 57 (ref 25–125)
Bilirubin, Total: 2.1

## 2020-12-05 LAB — BASIC METABOLIC PANEL
BUN: 10 (ref 4–21)
CO2: 29 — AB (ref 13–22)
Chloride: 102 (ref 99–108)
Creatinine: 0.8 (ref 0.5–1.1)
Potassium: 4.5 (ref 3.4–5.3)
Sodium: 140 (ref 137–147)

## 2020-12-05 LAB — HEMOGLOBIN A1C: Hemoglobin A1C: 5.4

## 2020-12-05 LAB — TESTOSTERONE: Testosterone: 150

## 2020-12-05 LAB — COMPREHENSIVE METABOLIC PANEL
Albumin: 4.6 (ref 3.5–5.0)
Calcium: 9.7 (ref 8.7–10.7)
GFR calc Af Amer: 93
GFR calc non Af Amer: 77

## 2020-12-05 LAB — VITAMIN D 25 HYDROXY (VIT D DEFICIENCY, FRACTURES): Vit D, 25-Hydroxy: 28.1

## 2020-12-05 LAB — TSH: TSH: 3.81 (ref 0.41–5.90)

## 2020-12-06 ENCOUNTER — Other Ambulatory Visit: Payer: Self-pay | Admitting: Family Medicine

## 2020-12-06 DIAGNOSIS — E038 Other specified hypothyroidism: Secondary | ICD-10-CM | POA: Diagnosis not present

## 2020-12-06 DIAGNOSIS — N951 Menopausal and female climacteric states: Secondary | ICD-10-CM | POA: Diagnosis not present

## 2020-12-06 DIAGNOSIS — R635 Abnormal weight gain: Secondary | ICD-10-CM | POA: Diagnosis not present

## 2020-12-06 DIAGNOSIS — Z6828 Body mass index (BMI) 28.0-28.9, adult: Secondary | ICD-10-CM | POA: Diagnosis not present

## 2020-12-06 DIAGNOSIS — G43119 Migraine with aura, intractable, without status migrainosus: Secondary | ICD-10-CM

## 2020-12-12 ENCOUNTER — Other Ambulatory Visit: Payer: Self-pay

## 2020-12-12 ENCOUNTER — Ambulatory Visit (INDEPENDENT_AMBULATORY_CARE_PROVIDER_SITE_OTHER): Payer: BC Managed Care – PPO | Admitting: Family Medicine

## 2020-12-12 ENCOUNTER — Encounter: Payer: Self-pay | Admitting: Family Medicine

## 2020-12-12 VITALS — BP 136/90 | HR 72 | Temp 97.8°F | Ht 68.0 in | Wt 189.6 lb

## 2020-12-12 DIAGNOSIS — G43119 Migraine with aura, intractable, without status migrainosus: Secondary | ICD-10-CM

## 2020-12-12 DIAGNOSIS — E539 Vitamin B deficiency, unspecified: Secondary | ICD-10-CM

## 2020-12-12 DIAGNOSIS — Z0001 Encounter for general adult medical examination with abnormal findings: Secondary | ICD-10-CM

## 2020-12-12 DIAGNOSIS — E663 Overweight: Secondary | ICD-10-CM

## 2020-12-12 DIAGNOSIS — Z0000 Encounter for general adult medical examination without abnormal findings: Secondary | ICD-10-CM

## 2020-12-12 DIAGNOSIS — E782 Mixed hyperlipidemia: Secondary | ICD-10-CM

## 2020-12-12 DIAGNOSIS — Z23 Encounter for immunization: Secondary | ICD-10-CM

## 2020-12-12 DIAGNOSIS — Z780 Asymptomatic menopausal state: Secondary | ICD-10-CM

## 2020-12-12 DIAGNOSIS — E559 Vitamin D deficiency, unspecified: Secondary | ICD-10-CM

## 2020-12-12 DIAGNOSIS — J302 Other seasonal allergic rhinitis: Secondary | ICD-10-CM

## 2020-12-12 MED ORDER — LEVOCETIRIZINE DIHYDROCHLORIDE 5 MG PO TABS: 5.0000 mg | ORAL_TABLET | Freq: Every evening | ORAL | 3 refills | Status: AC

## 2020-12-12 NOTE — Progress Notes (Signed)
Assessment & Plan:  1. Well adult exam - Preventive health education provided. Patient declined hepatitis C and HIV screening. She will return for Shingrix.  2. Mixed hyperlipidemia, - Well controlled on current regimen.   3. Vitamin D deficiency - Levels remain a little low on 2,000 units once daily. She is going to double this to use her current supply, and then purchase a 5,000 unit capsule.  4. Vitamin B deficiency - Well controlled on current regimen.   5. Intractable migraine with aura without status migrainosus - Well controlled on current regimen.   6. Seasonal allergies - Well controlled on current regimen.   7. Menopause - Hormone therapy managed by [facility].  8. Overweight - Starting a weight loss program with [facility].  9. Need for immunization against influenza - Flu Vaccine QUAD 36+ mos IM   Follow-up: Return in about 1 year (around 12/12/2021) for annual physical.   Hendricks Limes, MSN, APRN, FNP-C [facility]  Subjective:  Patient ID: [place] Harris, female    DOB: Jun 05, 1965  Age: 55 y.o. MRN: [id number]  Patient Care Team: Loman Brooklyn, FNP as PCP - General (Family Medicine) Vanessa Kick, MD as Consulting Physician (Obstetrics and Gynecology)   CC:  Chief Complaint  Patient presents with   Annual Exam    HPI [place] presents for her annual physical.  Occupation: [employer]; recently had a new job opportunity within [employer] open up to her, which she is still looking into. Marital status: Married Substance use: None Diet: starting weight loss program with [facility] Exercise: plans to start exercising with her new weight loss program Last colonoscopy: 01/31/2016 with repeat in 5 years Last mammogram: 10/19/2020 Last pap smear: s/p complete abdominal hysterectomy Hepatitis C Screening: declined Immunizations: Flu Vaccine:  getting today Tdap Vaccine: up to date  Shingrix Vaccine: declined  COVID-19 Vaccine: up to  date  DEPRESSION SCREENING PHQ 2/9 Scores 12/12/2020 12/02/2019 12/02/2018 02/01/2015  PHQ - 2 Score 0 0 0 0  PHQ- 9 Score 0 - - -     Hyperlipidemia Taking rosuvastatin daily. Cholesterol levels completed with [facility] on 12/04/2020.   Vitamin D Deficiency Taking a vitamin D supplement daily. Last vitamin D level a little low at 28.1 on 12/04/2020. She has been taking vitamin D3 2,000 units daily.  Vitamin B Deficiency Taking a vitamin B supplement. Last vitamin B level normal on 12/02/2019.  Migraines Has sumatriptan to take as needed. Much improved since she has been going to [facility] for hormone treatments.  Allergies Taking Flonase and Xyzal.  Postmenopausal Patient gets hormone treatments from [facility]. They are provided Amour thyroid, testosterone, estrogen, and progesterone.   Review of Systems  Constitutional:  Negative for chills, fever, malaise/fatigue and weight loss.  HENT:  Negative for congestion, ear discharge, ear pain, nosebleeds, sinus pain, sore throat and tinnitus.   Eyes:  Negative for blurred vision, double vision, pain, discharge and redness.  Respiratory:  Negative for cough, shortness of breath and wheezing.   Cardiovascular:  Negative for chest pain, palpitations and leg swelling.  Gastrointestinal:  Negative for abdominal pain, constipation, diarrhea, heartburn, nausea and vomiting.  Genitourinary:  Negative for dysuria, frequency and urgency.  Musculoskeletal:  Negative for myalgias.  Skin:  Negative for rash.  Neurological:  Negative for dizziness, seizures, weakness and headaches.  Psychiatric/Behavioral:  Negative for depression, substance abuse and suicidal ideas. The patient is not nervous/anxious.     Current Outpatient Medications:    Cyanocobalamin (VITAMIN B-12 PO),  Take by mouth., Disp: , Rfl:    fluticasone (FLONASE) 50 MCG/ACT nasal spray, Place 2 sprays into both nostrils daily., Disp: 16 g, Rfl: 6   levocetirizine (XYZAL) 5 MG tablet,  Take 1 tablet (5 mg total) by mouth every evening., Disp: 90 tablet, Rfl: 3   Multiple Vitamins-Minerals (MULTI-VITAMIN GUMMIES PO), Take by mouth daily., Disp: , Rfl:    progesterone (PROMETRIUM) 100 MG capsule, Take 150 mg by mouth daily., Disp: , Rfl:    rosuvastatin (CRESTOR) 5 MG tablet, TAKE 1 TABLET(5 MG) BY MOUTH EVERY EVENING, Disp: 90 tablet, Rfl: 3   SUMAtriptan 6 MG/0.5ML SOAJ, ADMINISTER 0.5 ML UNDER THE SKIN DAILY AS NEEDED. MAY REPEAT AFTER 2 HOURS AS NEEDED, Disp: 4 mL, Rfl: 0   Testosterone 25 MG PLLT, 125 mg by Implant route., Disp: , Rfl:    VITAMIN D, CHOLECALCIFEROL, PO, Take 2,000 mg by mouth daily., Disp: , Rfl:   Allergies  Allergen Reactions   Pollen Extract Other (See Comments)    Past Medical History:  Diagnosis Date   Allergy    Arthritis    Hyperlipidemia    Migraine    Vitamin B deficiency    Vitamin D deficiency     Past Surgical History:  Procedure Laterality Date   CHOLECYSTECTOMY  11/2016   NECK SURGERY     PARTIAL HYSTERECTOMY     due to uterine prolapse. removed cervix    Family History  Problem Relation Age of Onset   Hypertension Mother    Migraines Mother    Diabetes Father    Heart disease Father    Migraines Sister    Migraines Maternal Grandmother    Breast cancer Paternal Grandmother    Migraines Sister    Autism Son    Colon cancer Neg Hx     Social History   Socioeconomic History   Marital status: Married    Spouse name: Annya Lizana   Number of children: 1   Years of education: 12   Highest education level: Bachelor's degree (e.g., BA, AB, BS)  Occupational History   Occupation: Radio producer: [employer]    Employer: [employer]  Tobacco Use   Smoking status: Never   Smokeless tobacco: Never  Vaping Use   Vaping Use: Never used  Substance and Sexual Activity   Alcohol use: No   Drug use: No   Sexual activity: Yes  Other Topics Concern   Not on file  Social History Narrative   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     Lives with her husband.   Her son Deedra Ehrich lives with them. His children, Gerald Stabs and Mickel Baas, also live with them.   Social Determinants of Health   Financial Resource Strain: Not on file  Food Insecurity: Not on file  Transportation Needs: Not on file  Physical Activity: Not on file  Stress: Not on file  Social Connections: Not on file  Intimate Partner Violence: Not on file      Objective:    BP 136/90   Pulse 72   Temp 97.8 F (36.6 C) (Temporal)   Ht 5\' 8"  (1.727 m)   Wt 189 lb 9.6 oz (86 kg)   SpO2 98%   BMI 28.83 kg/m   Wt Readings from Last 3 Encounters:  12/12/20 189 lb 9.6 oz (86 kg)  05/17/20 188 lb 2 oz (85.3 kg)  12/02/19 190 lb 3.2 oz (86.3 kg)  Physical Exam Vitals reviewed.  Constitutional:      General: She is not in acute distress.    Appearance: Normal appearance. She is overweight. She is not ill-appearing, toxic-appearing or diaphoretic.  HENT:     Head: Normocephalic and atraumatic.     Right Ear: Tympanic membrane, ear canal and external ear normal. There is no impacted cerumen.     Left Ear: Tympanic membrane, ear canal and external ear normal. There is no impacted cerumen.     Nose: Nose normal. No congestion or rhinorrhea.     Mouth/Throat:     Mouth: Mucous membranes are moist.     Pharynx: Oropharynx is clear. No oropharyngeal exudate or posterior oropharyngeal erythema.  Eyes:     General: No scleral icterus.       Right eye: No discharge.        Left eye: No discharge.     Conjunctiva/sclera: Conjunctivae normal.     Pupils: Pupils are equal, round, and reactive to light.  Cardiovascular:     Rate and Rhythm: Normal rate and regular rhythm.     Heart sounds: Normal heart sounds. No murmur heard.   No friction rub. No gallop.  Pulmonary:     Effort: Pulmonary effort is normal. No respiratory distress.     Breath sounds: Normal breath sounds. No stridor. No  wheezing, rhonchi or rales.  Abdominal:     General: Abdomen is flat. Bowel sounds are normal. There is no distension.     Palpations: Abdomen is soft. There is no hepatomegaly, splenomegaly or mass.     Tenderness: There is no abdominal tenderness. There is no guarding or rebound.     Hernia: No hernia is present.  Musculoskeletal:        General: Normal range of motion.     Cervical back: Normal range of motion and neck supple. No rigidity. No muscular tenderness.  Lymphadenopathy:     Cervical: No cervical adenopathy.  Skin:    General: Skin is warm and dry.     Capillary Refill: Capillary refill takes less than 2 seconds.  Neurological:     General: No focal deficit present.     Mental Status: She is alert and oriented to person, place, and time. Mental status is at baseline.  Psychiatric:        Mood and Affect: Mood normal.        Behavior: Behavior normal.        Thought Content: Thought content normal.        Judgment: Judgment normal.    No results found for: TSH Lab Results  Component Value Date   WBC 7.9 12/02/2019   HGB 16.1 (H) 12/02/2019   HCT 48.1 (H) 12/02/2019   MCV 95 12/02/2019   PLT 272 12/02/2019   Lab Results  Component Value Date   NA 140 12/02/2019   K 4.7 12/02/2019   CO2 28 12/02/2019   GLUCOSE 102 (H) 12/02/2019   BUN 11 12/02/2019   CREATININE 0.93 12/02/2019   BILITOT 1.8 (H) 12/02/2019   ALKPHOS 68 12/02/2019   AST 16 12/02/2019   ALT 15 12/02/2019   PROT 7.1 12/02/2019   ALBUMIN 4.5 12/02/2019   CALCIUM 9.8 12/02/2019   Lab Results  Component Value Date   CHOL 160 12/02/2019   Lab Results  Component Value Date   HDL 47 12/02/2019   Lab Results  Component Value Date   LDLCALC 78 12/02/2019   Lab Results  Component  Value Date   TRIG 210 (H) 12/02/2019   Lab Results  Component Value Date   CHOLHDL 3.4 12/02/2019   No results found for: HGBA1C

## 2020-12-13 DIAGNOSIS — M2550 Pain in unspecified joint: Secondary | ICD-10-CM | POA: Diagnosis not present

## 2020-12-13 DIAGNOSIS — Z6828 Body mass index (BMI) 28.0-28.9, adult: Secondary | ICD-10-CM | POA: Diagnosis not present

## 2020-12-17 ENCOUNTER — Encounter: Payer: Self-pay | Admitting: Family Medicine

## 2020-12-19 ENCOUNTER — Encounter: Payer: Self-pay | Admitting: Family Medicine

## 2020-12-19 DIAGNOSIS — E8881 Metabolic syndrome: Secondary | ICD-10-CM | POA: Diagnosis not present

## 2020-12-19 DIAGNOSIS — Z6828 Body mass index (BMI) 28.0-28.9, adult: Secondary | ICD-10-CM | POA: Diagnosis not present

## 2020-12-27 DIAGNOSIS — M2550 Pain in unspecified joint: Secondary | ICD-10-CM | POA: Diagnosis not present

## 2020-12-27 DIAGNOSIS — Z6827 Body mass index (BMI) 27.0-27.9, adult: Secondary | ICD-10-CM | POA: Diagnosis not present

## 2021-01-02 DIAGNOSIS — M2550 Pain in unspecified joint: Secondary | ICD-10-CM | POA: Diagnosis not present

## 2021-01-02 DIAGNOSIS — Z6827 Body mass index (BMI) 27.0-27.9, adult: Secondary | ICD-10-CM | POA: Diagnosis not present

## 2021-01-03 ENCOUNTER — Other Ambulatory Visit: Payer: Self-pay | Admitting: Family Medicine

## 2021-01-03 DIAGNOSIS — G43119 Migraine with aura, intractable, without status migrainosus: Secondary | ICD-10-CM

## 2021-01-03 NOTE — Telephone Encounter (Signed)
OV 12/12/20 rtc 1 yr Next OV 12/13/21

## 2021-01-04 DIAGNOSIS — J301 Allergic rhinitis due to pollen: Secondary | ICD-10-CM | POA: Diagnosis not present

## 2021-01-04 DIAGNOSIS — J3081 Allergic rhinitis due to animal (cat) (dog) hair and dander: Secondary | ICD-10-CM | POA: Diagnosis not present

## 2021-01-09 DIAGNOSIS — Z6827 Body mass index (BMI) 27.0-27.9, adult: Secondary | ICD-10-CM | POA: Diagnosis not present

## 2021-01-09 DIAGNOSIS — R7309 Other abnormal glucose: Secondary | ICD-10-CM | POA: Diagnosis not present

## 2021-01-16 ENCOUNTER — Other Ambulatory Visit: Payer: Self-pay | Admitting: Family Medicine

## 2021-01-16 DIAGNOSIS — E785 Hyperlipidemia, unspecified: Secondary | ICD-10-CM

## 2021-01-23 ENCOUNTER — Encounter: Payer: Self-pay | Admitting: Gastroenterology

## 2021-01-23 DIAGNOSIS — E8881 Metabolic syndrome: Secondary | ICD-10-CM | POA: Diagnosis not present

## 2021-01-23 DIAGNOSIS — Z6827 Body mass index (BMI) 27.0-27.9, adult: Secondary | ICD-10-CM | POA: Diagnosis not present

## 2021-01-31 DIAGNOSIS — Z6827 Body mass index (BMI) 27.0-27.9, adult: Secondary | ICD-10-CM | POA: Diagnosis not present

## 2021-01-31 DIAGNOSIS — E559 Vitamin D deficiency, unspecified: Secondary | ICD-10-CM | POA: Diagnosis not present

## 2021-02-21 DIAGNOSIS — Z6826 Body mass index (BMI) 26.0-26.9, adult: Secondary | ICD-10-CM | POA: Diagnosis not present

## 2021-02-21 DIAGNOSIS — R7309 Other abnormal glucose: Secondary | ICD-10-CM | POA: Diagnosis not present

## 2021-02-26 ENCOUNTER — Encounter: Payer: Self-pay | Admitting: Family Medicine

## 2021-03-05 DIAGNOSIS — N951 Menopausal and female climacteric states: Secondary | ICD-10-CM | POA: Diagnosis not present

## 2021-03-05 DIAGNOSIS — E038 Other specified hypothyroidism: Secondary | ICD-10-CM | POA: Diagnosis not present

## 2021-03-08 DIAGNOSIS — N951 Menopausal and female climacteric states: Secondary | ICD-10-CM | POA: Diagnosis not present

## 2021-03-08 DIAGNOSIS — R6882 Decreased libido: Secondary | ICD-10-CM | POA: Diagnosis not present

## 2021-03-08 DIAGNOSIS — E038 Other specified hypothyroidism: Secondary | ICD-10-CM | POA: Diagnosis not present

## 2021-03-08 DIAGNOSIS — Z6827 Body mass index (BMI) 27.0-27.9, adult: Secondary | ICD-10-CM | POA: Diagnosis not present

## 2021-03-09 ENCOUNTER — Ambulatory Visit (AMBULATORY_SURGERY_CENTER): Payer: BC Managed Care – PPO | Admitting: *Deleted

## 2021-03-09 ENCOUNTER — Other Ambulatory Visit: Payer: Self-pay

## 2021-03-09 VITALS — Ht 68.0 in | Wt 177.0 lb

## 2021-03-09 DIAGNOSIS — Z86010 Personal history of colonic polyps: Secondary | ICD-10-CM

## 2021-03-09 MED ORDER — NA SULFATE-K SULFATE-MG SULF 17.5-3.13-1.6 GM/177ML PO SOLN: 1.0000 | Freq: Once | ORAL | 0 refills | Status: AC

## 2021-03-09 NOTE — Progress Notes (Signed)

## 2021-03-22 ENCOUNTER — Encounter: Payer: Self-pay | Admitting: Gastroenterology

## 2021-03-28 ENCOUNTER — Other Ambulatory Visit: Payer: Self-pay

## 2021-03-28 ENCOUNTER — Encounter: Payer: Self-pay | Admitting: Gastroenterology

## 2021-03-28 ENCOUNTER — Ambulatory Visit (AMBULATORY_SURGERY_CENTER): Payer: BC Managed Care – PPO | Admitting: Gastroenterology

## 2021-03-28 VITALS — BP 114/69 | HR 64 | Temp 98.6°F | Resp 15 | Ht 68.0 in | Wt 177.0 lb

## 2021-03-28 DIAGNOSIS — D125 Benign neoplasm of sigmoid colon: Secondary | ICD-10-CM | POA: Diagnosis not present

## 2021-03-28 DIAGNOSIS — D123 Benign neoplasm of transverse colon: Secondary | ICD-10-CM | POA: Diagnosis not present

## 2021-03-28 DIAGNOSIS — K635 Polyp of colon: Secondary | ICD-10-CM | POA: Diagnosis not present

## 2021-03-28 DIAGNOSIS — Z86010 Personal history of colonic polyps: Secondary | ICD-10-CM

## 2021-03-28 DIAGNOSIS — D122 Benign neoplasm of ascending colon: Secondary | ICD-10-CM

## 2021-03-28 DIAGNOSIS — Z1211 Encounter for screening for malignant neoplasm of colon: Secondary | ICD-10-CM | POA: Diagnosis not present

## 2021-03-28 MED ORDER — SODIUM CHLORIDE 0.9 % IV SOLN: 500.0000 mL | Freq: Once | INTRAVENOUS | Status: DC

## 2021-03-28 NOTE — Progress Notes (Signed)
Called to room to assist during endoscopic procedure.  Patient ID and intended procedure confirmed with present staff. Received instructions for my participation in the procedure from the performing physician.  

## 2021-03-28 NOTE — Op Note (Signed)
[facility] Patient Name: Elizabeth Harris Procedure Date: 03/28/2021 8:38 AM MRN: [id number] Endoscopist: Remo Lipps P. Havery Moros , MD Age: 56 Referring MD:  Date of Birth: Jul 23, 1965 Gender: Female Account #: [id number] Procedure:                Colonoscopy Indications:              High risk colon cancer surveillance: Personal                            history of colonic polyps - sessile serrated polyp                            removed 01/2016 Medicines:                Monitored Anesthesia Care Procedure:                Pre-Anesthesia Assessment:                           - Prior to the procedure, a History and Physical                            was performed, and patient medications and                            allergies were reviewed. The patient's tolerance of                            previous anesthesia was also reviewed. The risks                            and benefits of the procedure and the sedation                            options and risks were discussed with the patient.                            All questions were answered, and informed consent                            was obtained. Prior Anticoagulants: The patient has                            taken no previous anticoagulant or antiplatelet                            agents. ASA Grade Assessment: II - A patient with                            mild systemic disease. After reviewing the risks                            and benefits, the patient was deemed in  satisfactory condition to undergo the procedure.                           After obtaining informed consent, the colonoscope                            was passed under direct vision. Throughout the                            procedure, the patient's blood pressure, pulse, and                            oxygen saturations were monitored continuously. The                            PCF-HQ190L Colonoscope was introduced  through the                            anus and advanced to the the cecum, identified by                            appendiceal orifice and ileocecal valve. The                            colonoscopy was performed without difficulty. The                            patient tolerated the procedure well. The quality                            of the bowel preparation was good. The ileocecal                            valve, appendiceal orifice, and rectum were                            photographed. Scope In: 8:40:40 AM Scope Out: 9:06:12 AM Scope Withdrawal Time: 0 hours 17 minutes 46 seconds  Total Procedure Duration: 0 hours 25 minutes 32 seconds  Findings:                 The perianal and digital rectal examinations were                            normal.                           A 4 mm polyp was found in the ascending colon. The                            polyp was flat. The polyp was removed with a cold                            snare. Resection and retrieval were complete.  Three sessile polyps were found in the ascending                            colon. The polyps were 2 to 10 mm in size (61mm                            lesion was a cluster of multiple polyps in close                            approximation, about 40mm in size). These polyps                            were removed with a cold snare. Resection and                            retrieval were complete.                           Two flat and sessile polyps were found in the                            transverse colon. The polyps were 4 mm in size.                            These polyps were removed with a cold snare.                            Resection and retrieval were complete.                           A 5 mm polyp was found in the sigmoid colon. The                            polyp was flat. The polyp was removed with a cold                            snare. Resection and retrieval  were complete.                           Multiple small-mouthed diverticula were found in                            the sigmoid colon.                           The exam was otherwise without abnormality. Complications:            No immediate complications. Estimated blood loss:                            Minimal. Estimated Blood Loss:     Estimated blood loss was minimal. Impression:               - One 4 mm polyp in  the ascending colon, removed                            with a cold snare. Resected and retrieved.                           - Three 2 to 10 mm polyps in the ascending colon,                            removed with a cold snare. Resected and retrieved.                           - Two 4 mm polyps in the transverse colon, removed                            with a cold snare. Resected and retrieved.                           - One 5 mm polyp in the sigmoid colon, removed with                            a cold snare. Resected and retrieved.                           - Diverticulosis in the sigmoid colon.                           - The examination was otherwise normal. Recommendation:           - Patient has a contact number available for                            emergencies. The signs and symptoms of potential                            delayed complications were discussed with the                            patient. Return to normal activities tomorrow.                            Written discharge instructions were provided to the                            patient.                           - Resume previous diet.                           - Continue present medications.                           - Await pathology results. Remo Lipps P. Carrin Vannostrand, MD 03/28/2021 9:13:33 AM This report has been signed electronically.

## 2021-03-28 NOTE — Progress Notes (Signed)
Sedate, gd SR, tolerated procedure well, VSS, report to RN 

## 2021-03-28 NOTE — Progress Notes (Signed)
[facility] History and Physical   Primary Care Physician:  Loman Brooklyn, FNP   Reason for Procedure:   History of colon polyps  Plan:    colonoscopy     HPI: Elizabeth Harris is a 56 y.o. female  here for colonoscopy surveillance - history of sessile serrated polyp removed 01/2016. Patient denies any bowel symptoms at this time. No family history of colon cancer known. Otherwise feels well without any cardiopulmonary symptoms.    Past Medical History:  Diagnosis Date   Allergy    Arthritis    Hyperlipidemia    Migraine    Thyroid disease    Vitamin B deficiency    Vitamin D deficiency     Past Surgical History:  Procedure Laterality Date   CHOLECYSTECTOMY  11/2016   COLONOSCOPY     NECK SURGERY     POLYPECTOMY     TOTAL ABDOMINAL HYSTERECTOMY     due to uterine prolapse. removed cervix,PT.STATED PARTIAL    Prior to Admission medications   Medication Sig Start Date End Date Taking? Authorizing Provider  ARMOUR THYROID PO Take 15 mg by mouth daily.   Yes [provider]  Cyanocobalamin (VITAMIN B-12 PO) Take by mouth.   Yes [provider]  levocetirizine (XYZAL) 5 MG tablet Take 1 tablet (5 mg total) by mouth every evening. 12/12/20  Yes Hendricks Limes F, FNP  Multiple Vitamins-Minerals (MULTI-VITAMIN GUMMIES PO) Take by mouth daily.   Yes [provider]  progesterone (PROMETRIUM) 100 MG capsule Take 150 mg by mouth daily.   Yes [provider]  SUMAtriptan 6 MG/0.5ML SOAJ ADMINISTER 0.5 ML UNDER THE SKIN DAILY AS NEEDED. MAY REPEAT AFTER 2 HOURS AS NEEDED 01/03/21  Yes Hendricks Limes F, FNP  VITAMIN D, CHOLECALCIFEROL, PO Take 4,000 mg by mouth daily.   Yes [provider]  estrogens, conjugated, (PREMARIN) 0.3 MG tablet 1 tablet Patient not taking: Reported on 03/09/2021    [provider]  fluticasone (FLONASE) 50 MCG/ACT nasal spray Place 2 sprays into both nostrils daily. Patient not taking:  Reported on 03/09/2021 12/02/18   Loman Brooklyn, FNP  rosuvastatin (CRESTOR) 5 MG tablet TAKE 1 TABLET BY MOUTH IN  THE EVENING Patient not taking: Reported on 03/09/2021 01/17/21   Loman Brooklyn, FNP  Testosterone 25 MG PLLT 100 mg.    [provider]    Current Outpatient Medications  Medication Sig Dispense Refill   ARMOUR THYROID PO Take 15 mg by mouth daily.     Cyanocobalamin (VITAMIN B-12 PO) Take by mouth.     levocetirizine (XYZAL) 5 MG tablet Take 1 tablet (5 mg total) by mouth every evening. 90 tablet 3   Multiple Vitamins-Minerals (MULTI-VITAMIN GUMMIES PO) Take by mouth daily.     progesterone (PROMETRIUM) 100 MG capsule Take 150 mg by mouth daily.     SUMAtriptan 6 MG/0.5ML SOAJ ADMINISTER 0.5 ML UNDER THE SKIN DAILY AS NEEDED. MAY REPEAT AFTER 2 HOURS AS NEEDED 4 mL 2   VITAMIN D, CHOLECALCIFEROL, PO Take 4,000 mg by mouth daily.     estrogens, conjugated, (PREMARIN) 0.3 MG tablet 1 tablet (Patient not taking: Reported on 03/09/2021)     fluticasone (FLONASE) 50 MCG/ACT nasal spray Place 2 sprays into both nostrils daily. (Patient not taking: Reported on 03/09/2021) 16 g 6   rosuvastatin (CRESTOR) 5 MG tablet TAKE 1 TABLET BY MOUTH IN  THE EVENING (Patient not taking: Reported on 03/09/2021) 90 tablet 3   Testosterone  25 MG PLLT 100 mg.     Current Facility-Administered Medications  Medication Dose Route Frequency Provider Last Rate Last Admin   0.9 %  sodium chloride infusion  500 mL Intravenous Once Abby Tucholski, Carlota Raspberry, MD        Allergies as of 03/28/2021 - Review Complete 03/28/2021  Allergen Reaction Noted   Pollen extract Other (See Comments) 12/08/2020    Family History  Problem Relation Age of Onset   Colon polyps Mother    Hypertension Mother    Migraines Mother    Diabetes Father    Heart disease Father    Migraines Sister    Migraines Sister    Migraines Maternal Grandmother    Breast cancer Paternal Grandmother    Autism Son    Colon  cancer Neg Hx    Esophageal cancer Neg Hx    Rectal cancer Neg Hx    Stomach cancer Neg Hx     Social History   Socioeconomic History   Marital status: Married    Spouse name: [name]   Number of children: 1   Years of education: 12   Highest education level: Bachelor's degree (e.g., BA, AB, BS)  Occupational History   Occupation: Radio producer: [employer]    Employer: [employer]  Tobacco Use   Smoking status: Never   Smokeless tobacco: Never  Vaping Use   Vaping Use: Never used  Substance and Sexual Activity   Alcohol use: No   Drug use: No   Sexual activity: Yes  Other Topics Concern   Not on file  Social History Narrative   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     Lives with her husband.   Her son Deedra Ehrich lives with them. His children, Gerald Stabs and Mickel Baas, also live with them.   Social Determinants of Health   Financial Resource Strain: Not on file  Food Insecurity: Not on file  Transportation Needs: Not on file  Physical Activity: Not on file  Stress: Not on file  Social Connections: Not on file  Intimate Partner Violence: Not on file    Review of Systems: All other review of systems negative except as mentioned in the HPI.  Physical Exam: Vital signs BP 131/73    Pulse 69    Temp 98.6 F (37 C)    Ht 5\' 8"  (1.727 m)    Wt 177 lb (80.3 kg)    SpO2 100%    BMI 26.91 kg/m   General:   Alert,  Well-developed, pleasant and cooperative in NAD Lungs:  Clear throughout to auscultation.   Heart:  Regular rate and rhythm Abdomen:  Soft, nontender and nondistended.   Neuro/Psych:  Alert and cooperative. Normal mood and affect. A and O x 3  Jolly Mango, MD [facility]

## 2021-03-28 NOTE — Patient Instructions (Signed)
YOU HAD AN ENDOSCOPIC PROCEDURE TODAY AT THE [facility]:   Refer to the procedure report that was given to you for any specific questions about what was found during the examination.  If the procedure report does not answer your questions, please call your gastroenterologist to clarify.  If you requested that your care partner not be given the details of your procedure findings, then the procedure report has been included in a sealed envelope for you to review at your convenience later.  YOU SHOULD EXPECT: Some feelings of bloating in the abdomen. Passage of more gas than usual.  Walking can help get rid of the air that was put into your GI tract during the procedure and reduce the bloating. If you had a lower endoscopy (such as a colonoscopy or flexible sigmoidoscopy) you may notice spotting of blood in your stool or on the toilet paper. If you underwent a bowel prep for your procedure, you may not have a normal bowel movement for a few days.  Please Note:  You might notice some irritation and congestion in your nose or some drainage.  This is from the oxygen used during your procedure.  There is no need for concern and it should clear up in a day or so.  SYMPTOMS TO REPORT IMMEDIATELY:   Following lower endoscopy (colonoscopy or flexible sigmoidoscopy):  Excessive amounts of blood in the stool  Significant tenderness or worsening of abdominal pains  Swelling of the abdomen that is new, acute  Fever of 100F or higher   Following upper endoscopy (EGD)  Vomiting of blood or coffee ground material  New chest pain or pain under the shoulder blades  Painful or persistently difficult swallowing  New shortness of breath  Fever of 100F or higher  Black, tarry-looking stools  For urgent or emergent issues, a gastroenterologist can be reached at any hour by calling [phone]. Do not use MyChart messaging for urgent concerns.    DIET:  We do recommend a small meal at first, but  then you may proceed to your regular diet.  Drink plenty of fluids but you should avoid alcoholic beverages for 24 hours.  ACTIVITY:  You should plan to take it easy for the rest of today and you should NOT DRIVE or use heavy machinery until tomorrow (because of the sedation medicines used during the test).    FOLLOW UP: Our staff will call the number listed on your records 48-72 hours following your procedure to check on you and address any questions or concerns that you may have regarding the information given to you following your procedure. If we do not reach you, we will leave a message.  We will attempt to reach you two times.  During this call, we will ask if you have developed any symptoms of COVID 19. If you develop any symptoms (ie: fever, flu-like symptoms, shortness of breath, cough etc.) before then, please call [phone].  If you test positive for Covid 19 in the 2 weeks post procedure, please call and report this information to us.    If any biopsies were taken you will be contacted by phone or by letter within the next 1-3 weeks.  Please call us at [phone] if you have not heard about the biopsies in 3 weeks.    SIGNATURES/CONFIDENTIALITY: You and/or your care partner have signed paperwork which will be entered into your electronic medical record.  These signatures attest to the fact that that the information above on   your After Visit Summary has been reviewed and is understood.  Full responsibility of the confidentiality of this discharge information lies with you and/or your care-partner. 

## 2021-03-28 NOTE — Progress Notes (Signed)
Pt's states no medical or surgical changes since previsit or office visit. VS by AS.

## 2021-03-29 DIAGNOSIS — Z6826 Body mass index (BMI) 26.0-26.9, adult: Secondary | ICD-10-CM | POA: Diagnosis not present

## 2021-03-29 DIAGNOSIS — M2550 Pain in unspecified joint: Secondary | ICD-10-CM | POA: Diagnosis not present

## 2021-03-30 ENCOUNTER — Telehealth: Payer: Self-pay

## 2021-03-30 ENCOUNTER — Telehealth: Payer: Self-pay | Admitting: *Deleted

## 2021-03-30 NOTE — Telephone Encounter (Signed)
Called [phone] and left a message we tried to reach pt for a follow up call. maw

## 2021-03-30 NOTE — Telephone Encounter (Signed)
°  Follow up Call-  Call back number 03/28/2021  Post procedure Call Back phone  # [phone]  Permission to leave phone message Yes  Some recent data might be hidden     Patient questions:  Do you have a fever, pain , or abdominal swelling? No. Pain Score  0 *  Have you tolerated food without any problems? Yes.    Have you been able to return to your normal activities? Yes.    Do you have any questions about your discharge instructions: Diet   No. Medications  No. Follow up visit  No.  Do you have questions or concerns about your Care? No.  Actions: * If pain score is 4 or above: No action needed, pain <4.  Have you developed a fever since your procedure? no  2.   Have you had an respiratory symptoms (SOB or cough) since your procedure? no  3.   Have you tested positive for COVID 19 since your procedure no  4.   Have you had any family members/close contacts diagnosed with the COVID 19 since your procedure?  No    If yes to any of these questions please route to Joylene John, RN and Joella Prince, RN

## 2021-04-01 ENCOUNTER — Other Ambulatory Visit: Payer: Self-pay | Admitting: Family Medicine

## 2021-04-01 DIAGNOSIS — G43119 Migraine with aura, intractable, without status migrainosus: Secondary | ICD-10-CM

## 2021-04-06 DIAGNOSIS — Z6826 Body mass index (BMI) 26.0-26.9, adult: Secondary | ICD-10-CM | POA: Diagnosis not present

## 2021-04-06 DIAGNOSIS — M2550 Pain in unspecified joint: Secondary | ICD-10-CM | POA: Diagnosis not present

## 2021-04-13 DIAGNOSIS — E038 Other specified hypothyroidism: Secondary | ICD-10-CM | POA: Diagnosis not present

## 2021-04-13 DIAGNOSIS — Z6826 Body mass index (BMI) 26.0-26.9, adult: Secondary | ICD-10-CM | POA: Diagnosis not present

## 2021-04-25 DIAGNOSIS — Z6826 Body mass index (BMI) 26.0-26.9, adult: Secondary | ICD-10-CM | POA: Diagnosis not present

## 2021-04-25 DIAGNOSIS — N951 Menopausal and female climacteric states: Secondary | ICD-10-CM | POA: Diagnosis not present

## 2021-04-25 DIAGNOSIS — E559 Vitamin D deficiency, unspecified: Secondary | ICD-10-CM | POA: Diagnosis not present

## 2021-05-01 DIAGNOSIS — E038 Other specified hypothyroidism: Secondary | ICD-10-CM | POA: Diagnosis not present

## 2021-05-01 DIAGNOSIS — Z6826 Body mass index (BMI) 26.0-26.9, adult: Secondary | ICD-10-CM | POA: Diagnosis not present

## 2021-05-08 DIAGNOSIS — E559 Vitamin D deficiency, unspecified: Secondary | ICD-10-CM | POA: Diagnosis not present

## 2021-05-08 DIAGNOSIS — E038 Other specified hypothyroidism: Secondary | ICD-10-CM | POA: Diagnosis not present

## 2021-05-08 DIAGNOSIS — Z6826 Body mass index (BMI) 26.0-26.9, adult: Secondary | ICD-10-CM | POA: Diagnosis not present

## 2021-05-15 DIAGNOSIS — R7989 Other specified abnormal findings of blood chemistry: Secondary | ICD-10-CM | POA: Diagnosis not present

## 2021-05-15 DIAGNOSIS — Z6826 Body mass index (BMI) 26.0-26.9, adult: Secondary | ICD-10-CM | POA: Diagnosis not present

## 2021-05-15 DIAGNOSIS — E8881 Metabolic syndrome: Secondary | ICD-10-CM | POA: Diagnosis not present

## 2021-05-15 DIAGNOSIS — E038 Other specified hypothyroidism: Secondary | ICD-10-CM | POA: Diagnosis not present

## 2021-05-29 DIAGNOSIS — Z6826 Body mass index (BMI) 26.0-26.9, adult: Secondary | ICD-10-CM | POA: Diagnosis not present

## 2021-05-29 DIAGNOSIS — E038 Other specified hypothyroidism: Secondary | ICD-10-CM | POA: Diagnosis not present

## 2021-06-15 DIAGNOSIS — J3081 Allergic rhinitis due to animal (cat) (dog) hair and dander: Secondary | ICD-10-CM | POA: Diagnosis not present

## 2021-06-15 DIAGNOSIS — J301 Allergic rhinitis due to pollen: Secondary | ICD-10-CM | POA: Diagnosis not present

## 2021-07-12 DIAGNOSIS — E538 Deficiency of other specified B group vitamins: Secondary | ICD-10-CM | POA: Diagnosis not present

## 2021-07-19 DIAGNOSIS — J3089 Other allergic rhinitis: Secondary | ICD-10-CM | POA: Diagnosis not present

## 2021-07-19 DIAGNOSIS — H1045 Other chronic allergic conjunctivitis: Secondary | ICD-10-CM | POA: Diagnosis not present

## 2021-07-19 DIAGNOSIS — E559 Vitamin D deficiency, unspecified: Secondary | ICD-10-CM | POA: Diagnosis not present

## 2021-07-19 DIAGNOSIS — J301 Allergic rhinitis due to pollen: Secondary | ICD-10-CM | POA: Diagnosis not present

## 2021-07-19 DIAGNOSIS — J3081 Allergic rhinitis due to animal (cat) (dog) hair and dander: Secondary | ICD-10-CM | POA: Diagnosis not present

## 2021-07-19 DIAGNOSIS — E785 Hyperlipidemia, unspecified: Secondary | ICD-10-CM | POA: Diagnosis not present

## 2021-07-30 DIAGNOSIS — Z1339 Encounter for screening examination for other mental health and behavioral disorders: Secondary | ICD-10-CM | POA: Diagnosis not present

## 2021-07-30 DIAGNOSIS — Z1331 Encounter for screening for depression: Secondary | ICD-10-CM | POA: Diagnosis not present

## 2021-07-30 DIAGNOSIS — E785 Hyperlipidemia, unspecified: Secondary | ICD-10-CM | POA: Diagnosis not present

## 2021-07-30 DIAGNOSIS — Z0000 Encounter for general adult medical examination without abnormal findings: Secondary | ICD-10-CM | POA: Diagnosis not present

## 2021-08-02 ENCOUNTER — Other Ambulatory Visit: Payer: Self-pay | Admitting: Family Medicine

## 2021-08-02 DIAGNOSIS — Z1231 Encounter for screening mammogram for malignant neoplasm of breast: Secondary | ICD-10-CM

## 2021-08-10 ENCOUNTER — Other Ambulatory Visit: Payer: Self-pay | Admitting: Family Medicine

## 2021-08-10 DIAGNOSIS — G43119 Migraine with aura, intractable, without status migrainosus: Secondary | ICD-10-CM

## 2021-08-10 NOTE — Telephone Encounter (Signed)
Last OV 12/12/2020. Last RF 04/02/2021. Next OV 12/13/2021

## 2021-08-12 NOTE — Telephone Encounter (Signed)
This was ordered with 4 mL and 3 refills four months ago. There should be 8 doses with 4 mL and with 3 refills = 32 doses. If she has needed this in just four months, her migraines are not well controlled. She should schedule an appointment (may be telephone) so that we can address this.

## 2021-08-24 DIAGNOSIS — N390 Urinary tract infection, site not specified: Secondary | ICD-10-CM | POA: Diagnosis not present

## 2021-08-24 DIAGNOSIS — Z90710 Acquired absence of both cervix and uterus: Secondary | ICD-10-CM | POA: Diagnosis not present

## 2021-08-24 DIAGNOSIS — K6389 Other specified diseases of intestine: Secondary | ICD-10-CM | POA: Diagnosis not present

## 2021-08-24 DIAGNOSIS — M542 Cervicalgia: Secondary | ICD-10-CM | POA: Diagnosis not present

## 2021-08-24 DIAGNOSIS — M62830 Muscle spasm of back: Secondary | ICD-10-CM | POA: Diagnosis not present

## 2021-08-24 DIAGNOSIS — R319 Hematuria, unspecified: Secondary | ICD-10-CM | POA: Diagnosis not present

## 2021-08-24 DIAGNOSIS — M62838 Other muscle spasm: Secondary | ICD-10-CM | POA: Diagnosis not present

## 2021-08-24 DIAGNOSIS — Z981 Arthrodesis status: Secondary | ICD-10-CM | POA: Diagnosis not present

## 2021-08-24 DIAGNOSIS — R110 Nausea: Secondary | ICD-10-CM | POA: Diagnosis not present

## 2021-08-24 DIAGNOSIS — E039 Hypothyroidism, unspecified: Secondary | ICD-10-CM | POA: Diagnosis not present

## 2021-08-24 DIAGNOSIS — R1031 Right lower quadrant pain: Secondary | ICD-10-CM | POA: Diagnosis not present

## 2021-08-24 DIAGNOSIS — R10819 Abdominal tenderness, unspecified site: Secondary | ICD-10-CM | POA: Diagnosis not present

## 2021-08-24 DIAGNOSIS — N281 Cyst of kidney, acquired: Secondary | ICD-10-CM | POA: Diagnosis not present

## 2021-09-04 ENCOUNTER — Ambulatory Visit
Admission: RE | Admit: 2021-09-04 | Discharge: 2021-09-04 | Disposition: A | Discharge status: Home / Self Care | Payer: BC Managed Care – PPO | Source: Ambulatory Visit | Attending: Family Medicine | Admitting: Family Medicine

## 2021-09-04 DIAGNOSIS — Z1231 Encounter for screening mammogram for malignant neoplasm of breast: Secondary | ICD-10-CM

## 2021-09-25 ENCOUNTER — Encounter: Payer: Self-pay | Admitting: Family Medicine

## 2021-10-01 ENCOUNTER — Encounter: Payer: Self-pay | Admitting: *Deleted

## 2021-10-03 DIAGNOSIS — J301 Allergic rhinitis due to pollen: Secondary | ICD-10-CM | POA: Diagnosis not present

## 2021-10-03 DIAGNOSIS — J3081 Allergic rhinitis due to animal (cat) (dog) hair and dander: Secondary | ICD-10-CM | POA: Diagnosis not present

## 2021-10-25 ENCOUNTER — Other Ambulatory Visit: Payer: Self-pay | Admitting: Family Medicine

## 2021-10-25 DIAGNOSIS — G43119 Migraine with aura, intractable, without status migrainosus: Secondary | ICD-10-CM

## 2021-10-31 ENCOUNTER — Ambulatory Visit: Payer: BC Managed Care – PPO

## 2021-11-14 ENCOUNTER — Ambulatory Visit
Admission: RE | Admit: 2021-11-14 | Discharge: 2021-11-14 | Disposition: A | Discharge status: Home / Self Care | Payer: BC Managed Care – PPO | Source: Ambulatory Visit | Attending: Family Medicine | Admitting: Family Medicine

## 2021-11-14 DIAGNOSIS — Z1231 Encounter for screening mammogram for malignant neoplasm of breast: Secondary | ICD-10-CM | POA: Diagnosis not present

## 2021-11-26 IMAGING — MR MR CERVICAL SPINE W/O CM
5 series · 42 of 48 positions shown · non-contrast
Comparison: Cervical spine radiographs 05/17/2020

CLINICAL DATA: Right neck pain with right arm radiculopathy

EXAM:
MRI CERVICAL SPINE WITHOUT CONTRAST
TECHNIQUE: Multiplanar, multisequence MR imaging of the cervical spine was
performed. No intravenous contrast was administered.

[Series 5: T2 · sagittal · 3.0mm · 0.82mm/px · 6 of 15 slices shown (1 of 2)]
[im 1/15]
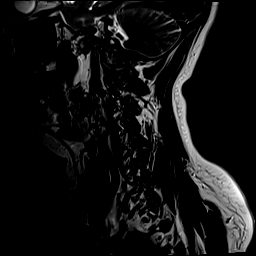
[im 3/15]
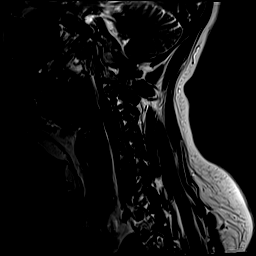
[im 6/15]
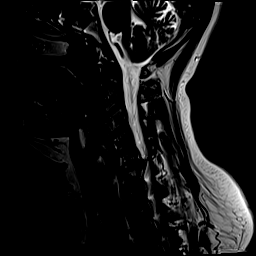
[im 9/15]
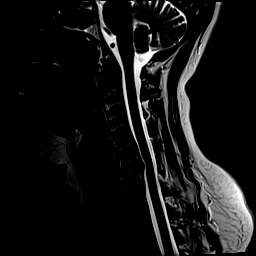
[im 12/15]
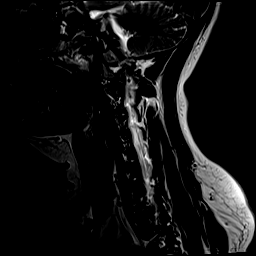
[im 15/15]
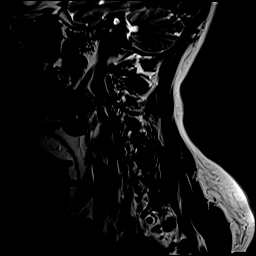

[Series 6: T1 · sagittal · 3.0mm · 0.82mm/px · 6 of 15 slices shown]
[im 1/15]
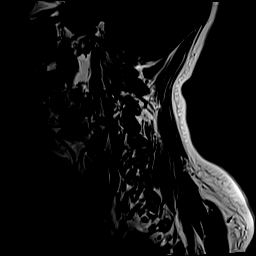
[im 3/15]
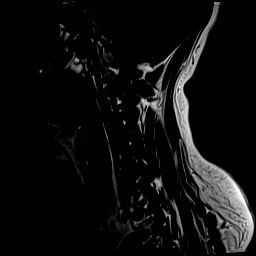
[im 6/15]
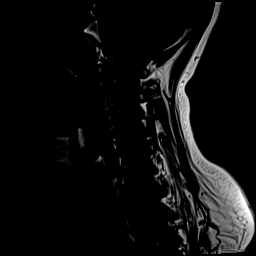
[im 9/15]
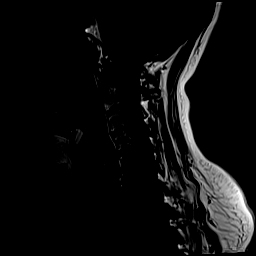
[im 12/15]
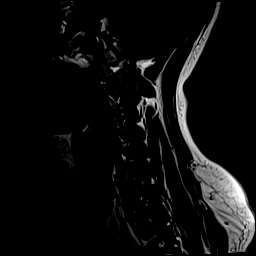
[im 15/15]
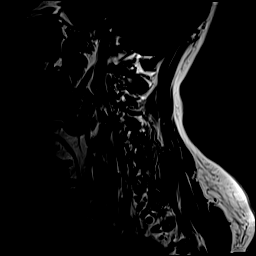

[Series 7: STIR · sagittal · 3.0mm · 0.41mm/px · 6 of 15 slices shown]
[im 1/15]
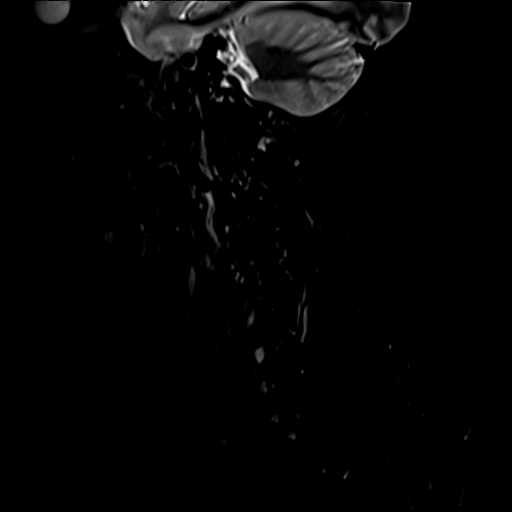
[im 3/15]
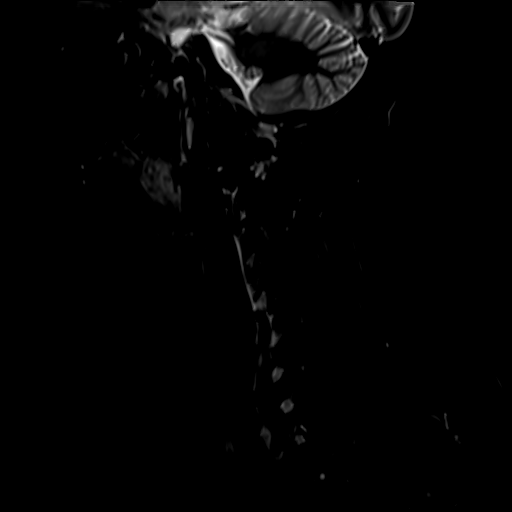
[im 6/15]
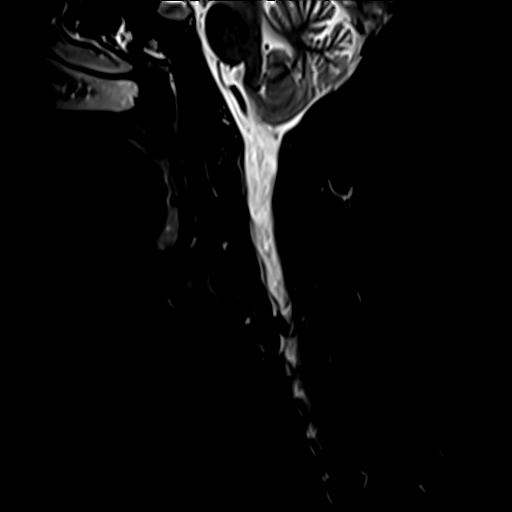
[im 9/15]
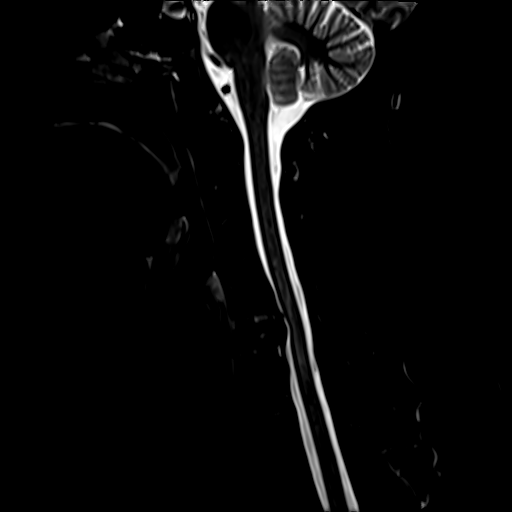
[im 12/15]
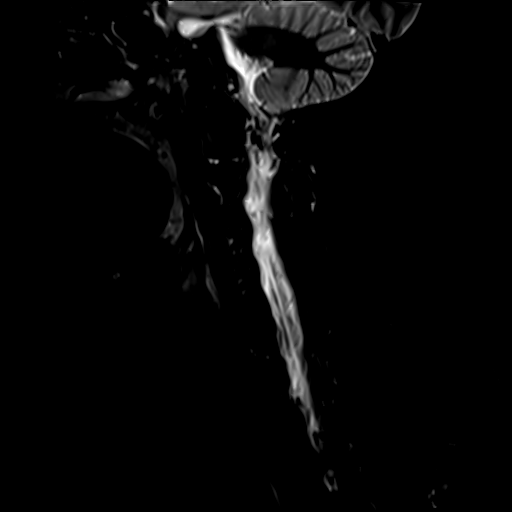
[im 15/15]
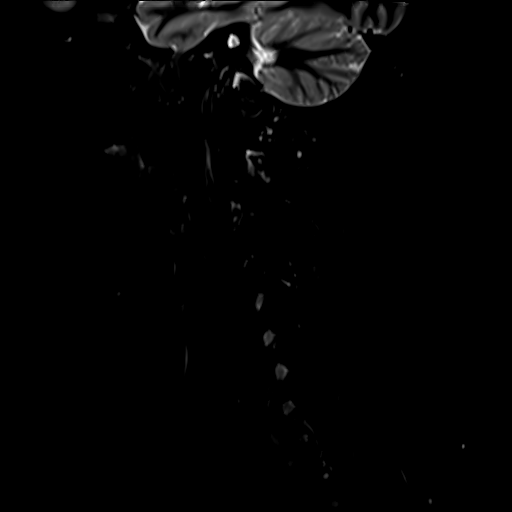

[Series 8: T2 · axial · 3.0mm · 0.62mm/px · z∈[-40,+70]mm · 15 of 35 slices shown (2 of 2)]
[im 1/35]
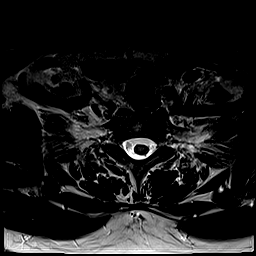
[im 3/35]
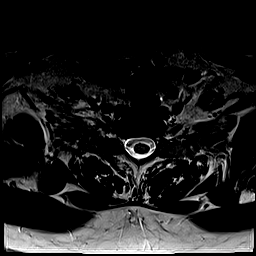
[im 5/35]
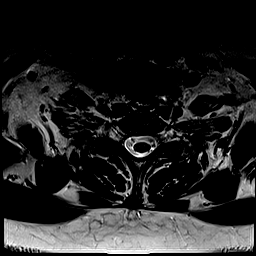
[im 8/35]
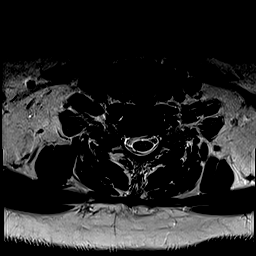
[im 10/35]
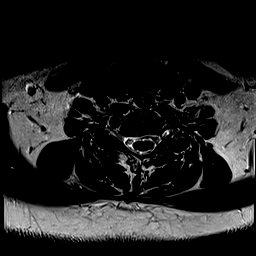
[im 13/35]
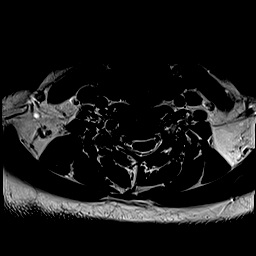
[im 15/35]
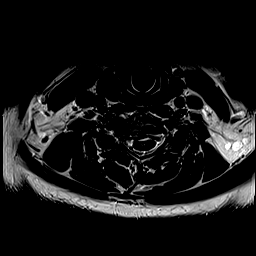
[im 18/35]
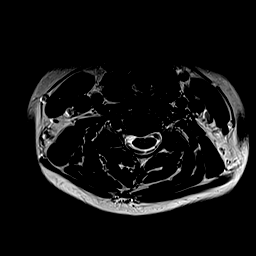
[im 20/35]
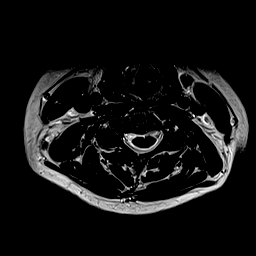
[im 22/35]
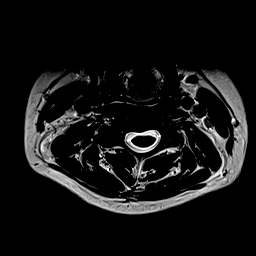
[im 25/35]
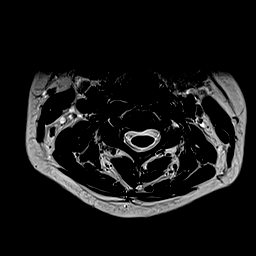
[im 27/35]
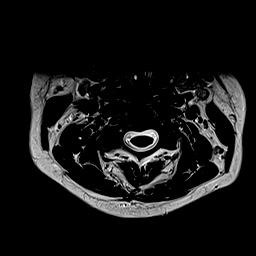
[im 30/35]
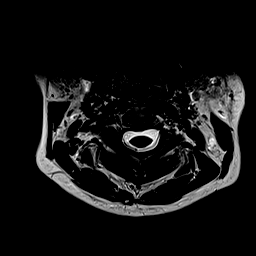
[im 32/35]
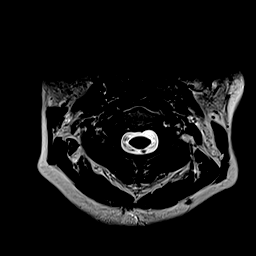
[im 35/35]
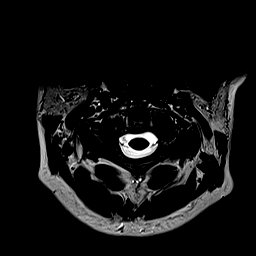

[Series 9: GRE · axial · 3.0mm · 0.42mm/px · z∈[-40,+70]mm · 9 of 35 slices shown]
[im 1/35]
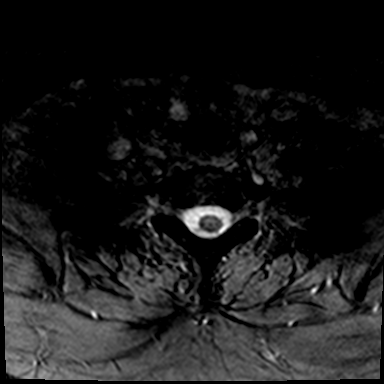
[im 3/35]
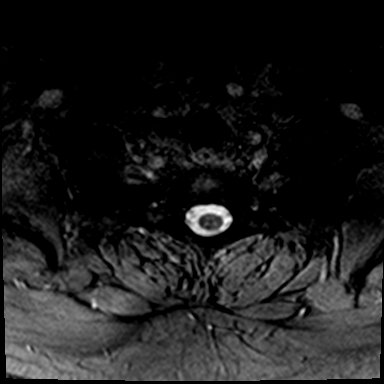
[im 5/35]
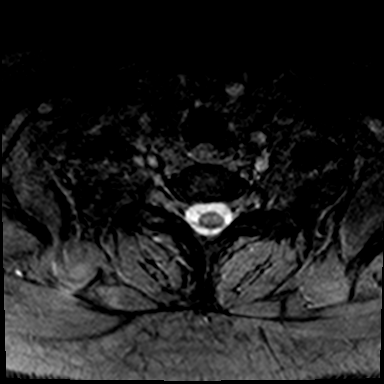
[im 10/35]
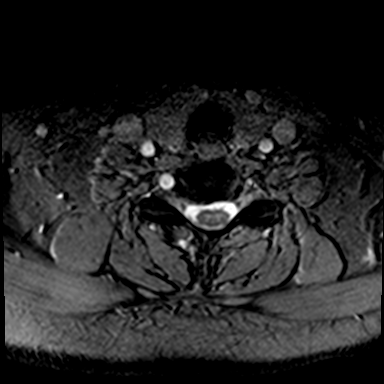
[im 15/35]
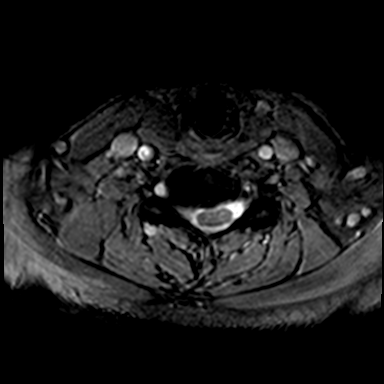
[im 20/35]
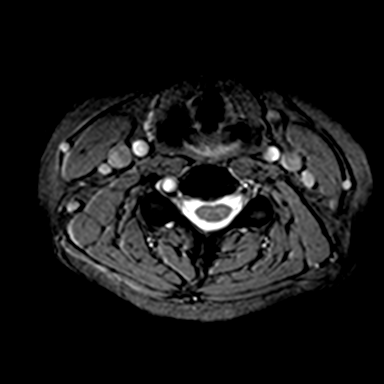
[im 25/35]
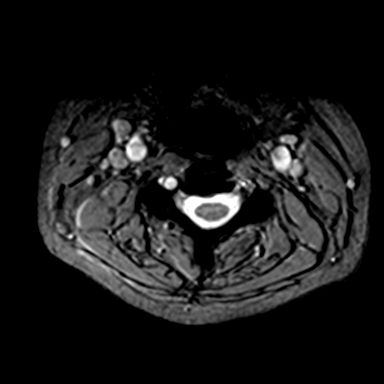
[im 30/35]
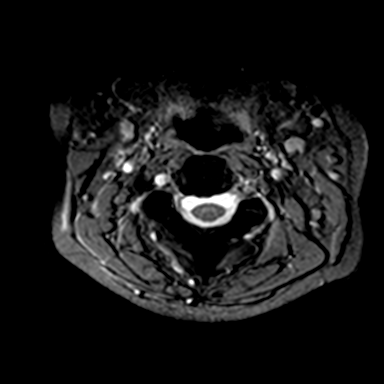
[im 35/35]
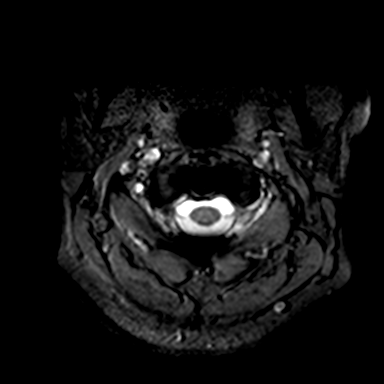

[42 of 48 positions shown; findings below may reference images not displayed]

FINDINGS: Alignment: Normal alignment with straightening of the cervical
kyphosis

Vertebrae: Negative for fracture or mass. Discogenic edema in the
bone marrow at C5-6.

Cord: Normal spinal cord signal.

Posterior Fossa, vertebral arteries, paraspinal tissues: Negative

Disc levels:

C2-3: Negative

C3-4: Negative

C4-5: Small central disc and osteophyte. No significant spinal or
foraminal stenosis

C5-6: Moderate to severe right foraminal encroachment due to disc
and osteophyte complex. Prominent osteophyte is noted on the recent
oblique radiograph. There may also be additional disc protrusion.
Mild spinal stenosis. Left foramen widely patent.

C6-7: Shallow right paracentral disc protrusion. Negative for
stenosis

C7-T1: Negative
IMPRESSION: Moderate to severe right foraminal encroachment at C5-6 due to disc
and osteophyte complex. Mild spinal stenosis C5-6.

## 2021-12-11 ENCOUNTER — Other Ambulatory Visit: Payer: Self-pay | Admitting: Family Medicine

## 2021-12-11 DIAGNOSIS — E785 Hyperlipidemia, unspecified: Secondary | ICD-10-CM

## 2021-12-13 ENCOUNTER — Encounter: Payer: BC Managed Care – PPO | Admitting: Family Medicine

## 2022-01-01 DIAGNOSIS — I10 Essential (primary) hypertension: Secondary | ICD-10-CM | POA: Diagnosis not present

## 2022-01-01 DIAGNOSIS — R0981 Nasal congestion: Secondary | ICD-10-CM | POA: Diagnosis not present

## 2022-01-07 DIAGNOSIS — M542 Cervicalgia: Secondary | ICD-10-CM | POA: Diagnosis not present

## 2022-01-28 DIAGNOSIS — I10 Essential (primary) hypertension: Secondary | ICD-10-CM | POA: Diagnosis not present

## 2022-01-28 DIAGNOSIS — N951 Menopausal and female climacteric states: Secondary | ICD-10-CM | POA: Diagnosis not present

## 2022-01-28 DIAGNOSIS — Z90710 Acquired absence of both cervix and uterus: Secondary | ICD-10-CM | POA: Diagnosis not present

## 2022-01-29 DIAGNOSIS — E039 Hypothyroidism, unspecified: Secondary | ICD-10-CM | POA: Diagnosis not present

## 2022-01-29 DIAGNOSIS — Z23 Encounter for immunization: Secondary | ICD-10-CM | POA: Diagnosis not present

## 2022-01-29 DIAGNOSIS — I10 Essential (primary) hypertension: Secondary | ICD-10-CM | POA: Diagnosis not present

## 2022-02-12 DIAGNOSIS — J3081 Allergic rhinitis due to animal (cat) (dog) hair and dander: Secondary | ICD-10-CM | POA: Diagnosis not present

## 2022-02-12 DIAGNOSIS — J301 Allergic rhinitis due to pollen: Secondary | ICD-10-CM | POA: Diagnosis not present

## 2022-02-26 DIAGNOSIS — M542 Cervicalgia: Secondary | ICD-10-CM | POA: Diagnosis not present

## 2022-03-01 DIAGNOSIS — M542 Cervicalgia: Secondary | ICD-10-CM | POA: Diagnosis not present

## 2022-03-01 DIAGNOSIS — R55 Syncope and collapse: Secondary | ICD-10-CM | POA: Diagnosis not present

## 2022-03-01 DIAGNOSIS — H538 Other visual disturbances: Secondary | ICD-10-CM | POA: Diagnosis not present

## 2022-03-01 DIAGNOSIS — I959 Hypotension, unspecified: Secondary | ICD-10-CM | POA: Diagnosis not present

## 2022-03-06 DIAGNOSIS — M542 Cervicalgia: Secondary | ICD-10-CM | POA: Diagnosis not present

## 2022-03-11 DIAGNOSIS — M542 Cervicalgia: Secondary | ICD-10-CM | POA: Diagnosis not present

## 2022-03-15 DIAGNOSIS — M542 Cervicalgia: Secondary | ICD-10-CM | POA: Diagnosis not present

## 2022-03-18 DIAGNOSIS — M542 Cervicalgia: Secondary | ICD-10-CM | POA: Diagnosis not present

## 2022-04-02 DIAGNOSIS — E785 Hyperlipidemia, unspecified: Secondary | ICD-10-CM | POA: Diagnosis not present

## 2022-04-02 DIAGNOSIS — R232 Flushing: Secondary | ICD-10-CM | POA: Diagnosis not present

## 2022-04-02 DIAGNOSIS — E039 Hypothyroidism, unspecified: Secondary | ICD-10-CM | POA: Diagnosis not present

## 2022-04-02 DIAGNOSIS — I10 Essential (primary) hypertension: Secondary | ICD-10-CM | POA: Diagnosis not present

## 2022-04-05 DIAGNOSIS — M542 Cervicalgia: Secondary | ICD-10-CM | POA: Diagnosis not present

## 2022-05-23 DIAGNOSIS — J3081 Allergic rhinitis due to animal (cat) (dog) hair and dander: Secondary | ICD-10-CM | POA: Diagnosis not present

## 2022-05-23 DIAGNOSIS — J301 Allergic rhinitis due to pollen: Secondary | ICD-10-CM | POA: Diagnosis not present

## 2022-06-06 DIAGNOSIS — E039 Hypothyroidism, unspecified: Secondary | ICD-10-CM | POA: Diagnosis not present

## 2022-06-06 DIAGNOSIS — G43809 Other migraine, not intractable, without status migrainosus: Secondary | ICD-10-CM | POA: Diagnosis not present

## 2022-06-06 DIAGNOSIS — Z79899 Other long term (current) drug therapy: Secondary | ICD-10-CM | POA: Diagnosis not present

## 2022-06-06 DIAGNOSIS — R519 Headache, unspecified: Secondary | ICD-10-CM | POA: Diagnosis not present

## 2022-06-18 DIAGNOSIS — R21 Rash and other nonspecific skin eruption: Secondary | ICD-10-CM | POA: Diagnosis not present

## 2022-06-18 DIAGNOSIS — M7989 Other specified soft tissue disorders: Secondary | ICD-10-CM | POA: Diagnosis not present

## 2022-06-18 DIAGNOSIS — M2550 Pain in unspecified joint: Secondary | ICD-10-CM | POA: Diagnosis not present

## 2022-07-23 DIAGNOSIS — J301 Allergic rhinitis due to pollen: Secondary | ICD-10-CM | POA: Diagnosis not present

## 2022-07-23 DIAGNOSIS — H1045 Other chronic allergic conjunctivitis: Secondary | ICD-10-CM | POA: Diagnosis not present

## 2022-07-23 DIAGNOSIS — J3081 Allergic rhinitis due to animal (cat) (dog) hair and dander: Secondary | ICD-10-CM | POA: Diagnosis not present

## 2022-07-23 DIAGNOSIS — J3089 Other allergic rhinitis: Secondary | ICD-10-CM | POA: Diagnosis not present

## 2022-08-07 DIAGNOSIS — R21 Rash and other nonspecific skin eruption: Secondary | ICD-10-CM | POA: Diagnosis not present

## 2022-08-07 DIAGNOSIS — R768 Other specified abnormal immunological findings in serum: Secondary | ICD-10-CM | POA: Diagnosis not present

## 2022-08-07 DIAGNOSIS — M2540 Effusion, unspecified joint: Secondary | ICD-10-CM | POA: Diagnosis not present

## 2022-08-07 DIAGNOSIS — M2560 Stiffness of unspecified joint, not elsewhere classified: Secondary | ICD-10-CM | POA: Diagnosis not present

## 2022-08-19 DIAGNOSIS — E538 Deficiency of other specified B group vitamins: Secondary | ICD-10-CM | POA: Diagnosis not present

## 2022-08-19 DIAGNOSIS — E559 Vitamin D deficiency, unspecified: Secondary | ICD-10-CM | POA: Diagnosis not present

## 2022-08-19 DIAGNOSIS — E039 Hypothyroidism, unspecified: Secondary | ICD-10-CM | POA: Diagnosis not present

## 2022-08-19 DIAGNOSIS — R739 Hyperglycemia, unspecified: Secondary | ICD-10-CM | POA: Diagnosis not present

## 2022-08-19 DIAGNOSIS — E785 Hyperlipidemia, unspecified: Secondary | ICD-10-CM | POA: Diagnosis not present

## 2022-08-26 DIAGNOSIS — Z1331 Encounter for screening for depression: Secondary | ICD-10-CM | POA: Diagnosis not present

## 2022-08-26 DIAGNOSIS — I10 Essential (primary) hypertension: Secondary | ICD-10-CM | POA: Diagnosis not present

## 2022-08-26 DIAGNOSIS — Z0000 Encounter for general adult medical examination without abnormal findings: Secondary | ICD-10-CM | POA: Diagnosis not present

## 2022-11-21 DIAGNOSIS — J301 Allergic rhinitis due to pollen: Secondary | ICD-10-CM | POA: Diagnosis not present

## 2022-11-21 DIAGNOSIS — J3081 Allergic rhinitis due to animal (cat) (dog) hair and dander: Secondary | ICD-10-CM | POA: Diagnosis not present

## 2023-03-27 DIAGNOSIS — J301 Allergic rhinitis due to pollen: Secondary | ICD-10-CM | POA: Diagnosis not present

## 2023-03-27 DIAGNOSIS — J3081 Allergic rhinitis due to animal (cat) (dog) hair and dander: Secondary | ICD-10-CM | POA: Diagnosis not present

## 2023-05-28 DIAGNOSIS — Z130 Encounter for screening for diseases of the blood and blood-forming organs and certain disorders involving the immune mechanism: Secondary | ICD-10-CM | POA: Diagnosis not present

## 2023-05-28 DIAGNOSIS — Z1231 Encounter for screening mammogram for malignant neoplasm of breast: Secondary | ICD-10-CM | POA: Diagnosis not present

## 2023-05-28 DIAGNOSIS — Z01419 Encounter for gynecological examination (general) (routine) without abnormal findings: Secondary | ICD-10-CM | POA: Diagnosis not present

## 2023-06-24 DIAGNOSIS — J3089 Other allergic rhinitis: Secondary | ICD-10-CM | POA: Diagnosis not present

## 2023-06-24 DIAGNOSIS — J301 Allergic rhinitis due to pollen: Secondary | ICD-10-CM | POA: Diagnosis not present

## 2023-06-24 DIAGNOSIS — H1045 Other chronic allergic conjunctivitis: Secondary | ICD-10-CM | POA: Diagnosis not present

## 2023-06-24 DIAGNOSIS — J3081 Allergic rhinitis due to animal (cat) (dog) hair and dander: Secondary | ICD-10-CM | POA: Diagnosis not present

## 2023-07-31 DIAGNOSIS — J3081 Allergic rhinitis due to animal (cat) (dog) hair and dander: Secondary | ICD-10-CM | POA: Diagnosis not present

## 2023-07-31 DIAGNOSIS — J301 Allergic rhinitis due to pollen: Secondary | ICD-10-CM | POA: Diagnosis not present

## 2023-08-27 DIAGNOSIS — E559 Vitamin D deficiency, unspecified: Secondary | ICD-10-CM | POA: Diagnosis not present

## 2023-08-27 DIAGNOSIS — E538 Deficiency of other specified B group vitamins: Secondary | ICD-10-CM | POA: Diagnosis not present

## 2023-08-27 DIAGNOSIS — E039 Hypothyroidism, unspecified: Secondary | ICD-10-CM | POA: Diagnosis not present

## 2023-08-27 DIAGNOSIS — I10 Essential (primary) hypertension: Secondary | ICD-10-CM | POA: Diagnosis not present

## 2023-08-28 DIAGNOSIS — L814 Other melanin hyperpigmentation: Secondary | ICD-10-CM | POA: Diagnosis not present

## 2023-08-28 DIAGNOSIS — L821 Other seborrheic keratosis: Secondary | ICD-10-CM | POA: Diagnosis not present

## 2023-08-28 DIAGNOSIS — L578 Other skin changes due to chronic exposure to nonionizing radiation: Secondary | ICD-10-CM | POA: Diagnosis not present

## 2023-08-28 DIAGNOSIS — D1801 Hemangioma of skin and subcutaneous tissue: Secondary | ICD-10-CM | POA: Diagnosis not present

## 2023-11-10 DIAGNOSIS — S80212A Abrasion, left knee, initial encounter: Secondary | ICD-10-CM | POA: Diagnosis not present

## 2023-11-10 DIAGNOSIS — Y92481 Parking lot as the place of occurrence of the external cause: Secondary | ICD-10-CM | POA: Diagnosis not present

## 2023-11-10 DIAGNOSIS — M7989 Other specified soft tissue disorders: Secondary | ICD-10-CM | POA: Diagnosis not present

## 2023-11-10 DIAGNOSIS — M25562 Pain in left knee: Secondary | ICD-10-CM | POA: Diagnosis not present

## 2023-11-10 DIAGNOSIS — M25531 Pain in right wrist: Secondary | ICD-10-CM | POA: Diagnosis not present

## 2023-11-10 DIAGNOSIS — M79641 Pain in right hand: Secondary | ICD-10-CM | POA: Diagnosis not present

## 2023-11-10 DIAGNOSIS — S63501A Unspecified sprain of right wrist, initial encounter: Secondary | ICD-10-CM | POA: Diagnosis not present

## 2023-11-10 DIAGNOSIS — M79674 Pain in right toe(s): Secondary | ICD-10-CM | POA: Diagnosis not present

## 2023-11-10 DIAGNOSIS — S90111A Contusion of right great toe without damage to nail, initial encounter: Secondary | ICD-10-CM | POA: Diagnosis not present

## 2023-11-10 DIAGNOSIS — W010XXA Fall on same level from slipping, tripping and stumbling without subsequent striking against object, initial encounter: Secondary | ICD-10-CM | POA: Diagnosis not present

## 2023-11-22 DIAGNOSIS — M79641 Pain in right hand: Secondary | ICD-10-CM | POA: Diagnosis not present

## 2023-11-22 DIAGNOSIS — S62346A Nondisplaced fracture of base of fifth metacarpal bone, right hand, initial encounter for closed fracture: Secondary | ICD-10-CM | POA: Diagnosis not present

## 2023-11-25 DIAGNOSIS — I10 Essential (primary) hypertension: Secondary | ICD-10-CM | POA: Diagnosis not present

## 2023-11-25 DIAGNOSIS — Z1331 Encounter for screening for depression: Secondary | ICD-10-CM | POA: Diagnosis not present

## 2023-11-25 DIAGNOSIS — Z1339 Encounter for screening examination for other mental health and behavioral disorders: Secondary | ICD-10-CM | POA: Diagnosis not present

## 2023-11-25 DIAGNOSIS — Z0000 Encounter for general adult medical examination without abnormal findings: Secondary | ICD-10-CM | POA: Diagnosis not present

## 2023-11-25 DIAGNOSIS — Z23 Encounter for immunization: Secondary | ICD-10-CM | POA: Diagnosis not present

## 2023-11-25 DIAGNOSIS — R82998 Other abnormal findings in urine: Secondary | ICD-10-CM | POA: Diagnosis not present

## 2023-12-05 DIAGNOSIS — J3081 Allergic rhinitis due to animal (cat) (dog) hair and dander: Secondary | ICD-10-CM | POA: Diagnosis not present

## 2023-12-05 DIAGNOSIS — J301 Allergic rhinitis due to pollen: Secondary | ICD-10-CM | POA: Diagnosis not present

## 2023-12-17 DIAGNOSIS — S62326A Displaced fracture of shaft of fifth metacarpal bone, right hand, initial encounter for closed fracture: Secondary | ICD-10-CM | POA: Diagnosis not present

## 2023-12-17 DIAGNOSIS — S62314A Displaced fracture of base of fourth metacarpal bone, right hand, initial encounter for closed fracture: Secondary | ICD-10-CM | POA: Diagnosis not present

## 2023-12-17 DIAGNOSIS — M79641 Pain in right hand: Secondary | ICD-10-CM | POA: Diagnosis not present

## 2024-01-14 DIAGNOSIS — M79641 Pain in right hand: Secondary | ICD-10-CM | POA: Diagnosis not present

## 2024-03-01 ENCOUNTER — Encounter: Payer: Self-pay | Admitting: Gastroenterology

## 2024-03-26 ENCOUNTER — Ambulatory Visit: Payer: Self-pay

## 2024-03-26 VITALS — Ht 68.0 in | Wt 183.0 lb

## 2024-03-26 DIAGNOSIS — Z860100 Personal history of colon polyps, unspecified: Secondary | ICD-10-CM

## 2024-03-26 MED ORDER — ONDANSETRON HCL 4 MG PO TABS: 4.0000 mg | ORAL_TABLET | ORAL | 0 refills | Status: AC

## 2024-03-26 NOTE — Progress Notes (Signed)
 PCP MD at time of PV: Holwerda, MD __________________________________________________________________________________________________________________________________________  No egg allergy known to patient  No soy allergy known to patient No issues known to pt with past sedation with any surgeries or procedures Patient denies ever being told they had issues or difficulty with intubation  No FH of Malignant Hyperthermia Pt is not on diet pills Pt is not on  home 02  Pt is not on blood thinners  No A fib or A flutter Have any cardiac testing pending--no  LOA: independent  No Chew or Snuff tobacco __________________________________________________________________________________________________________________________________________  Constipation: yes  Prep: spilt dose miralax  __________________________________________________________________________________________________________________________________________  PV completed with patient. Prep instructions reviewed and provided during apt. Rx sent to preferred pharmacy.  __________________________________________________________________________________________________________________________________________  Patient's chart reviewed by Norleen Schillings CNRA prior to previsit and patient appropriate for the [facility].  Previsit completed and red dot placed by patient's name on their procedure day (on provider's schedule).

## 2024-04-08 ENCOUNTER — Encounter: Admitting: Gastroenterology
